# Patient Record
Sex: Female | Born: 1945
Health system: Southern US, Community
[De-identification: ages and names within clinical notes are randomized; demographics above are authoritative.]

## PROBLEM LIST (undated history)

## (undated) DIAGNOSIS — D649 Anemia, unspecified: Secondary | ICD-10-CM

## (undated) DIAGNOSIS — M810 Age-related osteoporosis without current pathological fracture: Secondary | ICD-10-CM

## (undated) DIAGNOSIS — E7800 Pure hypercholesterolemia, unspecified: Secondary | ICD-10-CM

## (undated) DIAGNOSIS — E559 Vitamin D deficiency, unspecified: Secondary | ICD-10-CM

## (undated) DIAGNOSIS — C801 Malignant (primary) neoplasm, unspecified: Secondary | ICD-10-CM

## (undated) DIAGNOSIS — E538 Deficiency of other specified B group vitamins: Secondary | ICD-10-CM

## (undated) DIAGNOSIS — K635 Polyp of colon: Secondary | ICD-10-CM

## (undated) HISTORY — DX: Age-related osteoporosis without current pathological fracture: M81.0

## (undated) HISTORY — PX: OTHER SURGICAL HISTORY: SHX169

## (undated) HISTORY — DX: Vitamin D deficiency, unspecified: E55.9

## (undated) HISTORY — DX: Polyp of colon: K63.5

## (undated) HISTORY — DX: Deficiency of other specified B group vitamins: E53.8

## (undated) HISTORY — DX: Anemia, unspecified: D64.9

## (undated) HISTORY — PX: COLONOSCOPY: SHX174

## (undated) HISTORY — DX: Pure hypercholesterolemia, unspecified: E78.00

---

## 1998-06-22 ENCOUNTER — Other Ambulatory Visit: Admission: RE | Admit: 1998-06-22 | Discharge: 1998-06-22 | Payer: Self-pay | Admitting: *Deleted

## 1999-09-27 ENCOUNTER — Encounter: Payer: Self-pay | Admitting: Internal Medicine

## 1999-09-27 ENCOUNTER — Encounter: Admission: RE | Admit: 1999-09-27 | Discharge: 1999-09-27 | Payer: Self-pay | Admitting: Internal Medicine

## 2000-06-05 ENCOUNTER — Ambulatory Visit (HOSPITAL_COMMUNITY): Admission: RE | Admit: 2000-06-05 | Discharge: 2000-06-05 | Payer: Self-pay | Admitting: Gastroenterology

## 2001-01-15 ENCOUNTER — Encounter: Admission: RE | Admit: 2001-01-15 | Discharge: 2001-01-15 | Payer: Self-pay | Admitting: Internal Medicine

## 2001-01-15 ENCOUNTER — Encounter: Payer: Self-pay | Admitting: Internal Medicine

## 2002-07-25 ENCOUNTER — Encounter: Payer: Self-pay | Admitting: Internal Medicine

## 2002-07-25 ENCOUNTER — Encounter: Admission: RE | Admit: 2002-07-25 | Discharge: 2002-07-25 | Payer: Self-pay | Admitting: Internal Medicine

## 2003-03-29 ENCOUNTER — Encounter: Payer: Self-pay | Admitting: *Deleted

## 2003-03-29 ENCOUNTER — Encounter: Admission: RE | Admit: 2003-03-29 | Discharge: 2003-03-29 | Payer: Self-pay | Admitting: *Deleted

## 2003-10-10 ENCOUNTER — Encounter: Admission: RE | Admit: 2003-10-10 | Discharge: 2003-10-10 | Payer: Self-pay | Admitting: Internal Medicine

## 2004-12-24 ENCOUNTER — Encounter: Admission: RE | Admit: 2004-12-24 | Discharge: 2004-12-24 | Payer: Self-pay | Admitting: *Deleted

## 2005-07-22 ENCOUNTER — Other Ambulatory Visit: Admission: RE | Admit: 2005-07-22 | Discharge: 2005-07-22 | Payer: Self-pay | Admitting: *Deleted

## 2006-04-22 ENCOUNTER — Encounter: Admission: RE | Admit: 2006-04-22 | Discharge: 2006-04-22 | Payer: Self-pay | Admitting: Internal Medicine

## 2007-07-20 ENCOUNTER — Encounter: Admission: RE | Admit: 2007-07-20 | Discharge: 2007-07-20 | Payer: Self-pay | Admitting: Family Medicine

## 2008-09-15 ENCOUNTER — Encounter: Admission: RE | Admit: 2008-09-15 | Discharge: 2008-09-15 | Payer: Self-pay | Admitting: Family Medicine

## 2009-09-21 ENCOUNTER — Encounter: Admission: RE | Admit: 2009-09-21 | Discharge: 2009-09-21 | Payer: Self-pay | Admitting: Family Medicine

## 2010-02-27 ENCOUNTER — Encounter: Admission: RE | Admit: 2010-02-27 | Discharge: 2010-02-27 | Payer: Self-pay | Admitting: Family Medicine

## 2010-08-24 ENCOUNTER — Encounter: Payer: Self-pay | Admitting: *Deleted

## 2010-09-03 ENCOUNTER — Other Ambulatory Visit: Payer: Self-pay | Admitting: Family Medicine

## 2010-09-03 DIAGNOSIS — Z1239 Encounter for other screening for malignant neoplasm of breast: Secondary | ICD-10-CM

## 2010-09-24 ENCOUNTER — Ambulatory Visit
Admission: RE | Admit: 2010-09-24 | Discharge: 2010-09-24 | Disposition: A | Discharge status: Home / Self Care | Payer: BC Managed Care – PPO | Source: Ambulatory Visit | Attending: Family Medicine | Admitting: Family Medicine

## 2010-09-24 DIAGNOSIS — Z1239 Encounter for other screening for malignant neoplasm of breast: Secondary | ICD-10-CM

## 2010-12-20 NOTE — Op Note (Signed)
[facility]  Patient:    Emily Bell, Emily Bell                      MRN: [id number] Adm. Date:  [id number] Attending:  Deneen Harts CC:         Gretta Cool, M.D.   Operative Report  PROCEDURE:  Colonoscopy.  INDICATIONS:  A 65 year old African-American female undergoing colonoscopy for neoplasia surveillance.  Last examination was four years ago, 1997, at which time cecal sessile adenomatous polyps were resected.  In addition, she underwent hemorrhoid injection sclerotherapy.  She remembers having quite a bit of pain in the rectum following injection therapy.  Standard scope was used without difficulties with intubation.  During the interim four years, she has done quite well.  She has postponed todays procedure for an additional year due to her recollections of discomfort at the time of colonoscopy, again due to injection sclerotherapy. Occasional bright red blood per rectum.  Denies pain.  No change in bowel habits.  DESCRIPTION OF PROCEDURE:  After reviewing the nature of the procedure with the patient including potential risks and complications including perforation and hemorrhage, informed consent was signed.  I agreed with the patient prior to starting the procedure that we would not employ injection sclerotherapy due to her pain symptoms when last applied.  The patient was premedicated receiving IV sedation administered in divided doses prior to and during the course of the procedure, totalling Versed 10 mg, Fentanyl 100 mcg IV.  Using an Olympus PCF-140L pediatric video colonoscope, the rectum was intubated after normal digital examination except for the finding of internal and external hemorrhoids.  The scope was inserted and advanced with some difficulty around a very tortuous, redundant hepatic flexure.  The intubation was much more difficult than was described with a standard scope, and I believe this was due to the lack of  stiffness of the pediatric model. Ultimately, the cecum was identified after rotating the patient into a right lateral decubitus position.  Preparation was good throughout.  Some retained Visicol residue in the right colon.  Cecum identified by the ileocecal valve.  Excellent inspection of the cecum did not reveal any residual neoplasia.  No new lesions identified.  The scope was slowly withdrawn with careful inspection of the entire colon in a retrograde manner including retroflex view in the rectal vault.  There was no evidence of colorectal neoplasia.  No diverticular disease, vascular lesion, or mucosal inflammation.  Retroflex view in the rectal vault did reveal large internal hemorrhoids with red spots and bright red blood mixed with mucous in the rectum, small volume. This could be irrigated clear.  There was no active oozing or bleeding.  The scope was straightened, and the colon was decompressed.  The scope was withdrawn.  The patient tolerated the procedure without difficulty being maintained on datascope monitor and low-flow oxygen throughout.  Returned to recovery in stable condition.  Time:  1 Technical:  2 Preparation:  2 Totals:  5  ASSESSMENT: 1. Internal hemorrhoids, bright red blood per rectum associated with    hemorrhoids.  Suspect hematochezia is also due to hemorrhoidal bleeding. 2. No evidence of colorectal neoplasia.  RECOMMENDATIONS: 1. Repeat colonoscopy in five years. 2. Annual hemoccult, though this may be compromised by patients    hemorrhoidal disease. 3. Anusol-HC suppository, one p.r. b.i.d. for one week.  Repeat p.r.n. 4. With follow-up endoscopy in five years, use standard scope for improved    intubation.  DD:  06/05/00 TD:  06/06/00 Job: [id number]

## 2011-04-19 ENCOUNTER — Inpatient Hospital Stay (INDEPENDENT_AMBULATORY_CARE_PROVIDER_SITE_OTHER)
Admission: RE | Admit: 2011-04-19 | Discharge: 2011-04-19 | Disposition: A | Discharge status: Home / Self Care | Payer: Medicare PPO | Source: Ambulatory Visit | Attending: Family Medicine | Admitting: Family Medicine

## 2011-04-19 DIAGNOSIS — B851 Pediculosis due to Pediculus humanus corporis: Secondary | ICD-10-CM

## 2011-04-19 DIAGNOSIS — L989 Disorder of the skin and subcutaneous tissue, unspecified: Secondary | ICD-10-CM

## 2011-11-25 ENCOUNTER — Other Ambulatory Visit: Payer: Self-pay | Admitting: Internal Medicine

## 2011-11-25 DIAGNOSIS — Z1231 Encounter for screening mammogram for malignant neoplasm of breast: Secondary | ICD-10-CM

## 2011-11-26 DIAGNOSIS — H6120 Impacted cerumen, unspecified ear: Secondary | ICD-10-CM | POA: Diagnosis not present

## 2011-12-08 ENCOUNTER — Ambulatory Visit: Payer: Medicare PPO

## 2012-01-13 ENCOUNTER — Ambulatory Visit
Admission: RE | Admit: 2012-01-13 | Discharge: 2012-01-13 | Disposition: A | Discharge status: Home / Self Care | Payer: Self-pay | Source: Ambulatory Visit | Attending: Internal Medicine | Admitting: Internal Medicine

## 2012-01-13 DIAGNOSIS — Z1231 Encounter for screening mammogram for malignant neoplasm of breast: Secondary | ICD-10-CM

## 2012-02-10 DIAGNOSIS — Z129 Encounter for screening for malignant neoplasm, site unspecified: Secondary | ICD-10-CM | POA: Diagnosis not present

## 2012-02-10 DIAGNOSIS — E7800 Pure hypercholesterolemia, unspecified: Secondary | ICD-10-CM | POA: Diagnosis not present

## 2012-02-10 DIAGNOSIS — Z79899 Other long term (current) drug therapy: Secondary | ICD-10-CM | POA: Diagnosis not present

## 2012-02-10 DIAGNOSIS — Z0000 Encounter for general adult medical examination without abnormal findings: Secondary | ICD-10-CM | POA: Diagnosis not present

## 2012-02-10 DIAGNOSIS — Z124 Encounter for screening for malignant neoplasm of cervix: Secondary | ICD-10-CM | POA: Diagnosis not present

## 2012-02-10 DIAGNOSIS — R5381 Other malaise: Secondary | ICD-10-CM | POA: Diagnosis not present

## 2012-02-10 DIAGNOSIS — R5383 Other fatigue: Secondary | ICD-10-CM | POA: Diagnosis not present

## 2012-02-16 DIAGNOSIS — R109 Unspecified abdominal pain: Secondary | ICD-10-CM | POA: Diagnosis not present

## 2012-02-16 DIAGNOSIS — K8020 Calculus of gallbladder without cholecystitis without obstruction: Secondary | ICD-10-CM | POA: Diagnosis not present

## 2012-06-02 DIAGNOSIS — Z23 Encounter for immunization: Secondary | ICD-10-CM | POA: Diagnosis not present

## 2012-06-14 DIAGNOSIS — R109 Unspecified abdominal pain: Secondary | ICD-10-CM | POA: Diagnosis not present

## 2012-06-16 DIAGNOSIS — R1011 Right upper quadrant pain: Secondary | ICD-10-CM | POA: Diagnosis not present

## 2012-06-16 DIAGNOSIS — R1012 Left upper quadrant pain: Secondary | ICD-10-CM | POA: Diagnosis not present

## 2012-08-24 DIAGNOSIS — N390 Urinary tract infection, site not specified: Secondary | ICD-10-CM | POA: Diagnosis not present

## 2012-09-28 DIAGNOSIS — K839 Disease of biliary tract, unspecified: Secondary | ICD-10-CM | POA: Diagnosis not present

## 2012-09-28 DIAGNOSIS — R109 Unspecified abdominal pain: Secondary | ICD-10-CM | POA: Diagnosis not present

## 2013-01-29 DIAGNOSIS — L2089 Other atopic dermatitis: Secondary | ICD-10-CM | POA: Diagnosis not present

## 2013-02-28 ENCOUNTER — Other Ambulatory Visit: Payer: Self-pay

## 2013-02-28 DIAGNOSIS — Z1231 Encounter for screening mammogram for malignant neoplasm of breast: Secondary | ICD-10-CM

## 2013-03-16 ENCOUNTER — Ambulatory Visit
Admission: RE | Admit: 2013-03-16 | Discharge: 2013-03-16 | Disposition: A | Discharge status: Home / Self Care | Payer: Self-pay | Source: Ambulatory Visit

## 2013-03-16 DIAGNOSIS — Z1231 Encounter for screening mammogram for malignant neoplasm of breast: Secondary | ICD-10-CM

## 2013-03-17 DIAGNOSIS — Z0000 Encounter for general adult medical examination without abnormal findings: Secondary | ICD-10-CM | POA: Diagnosis not present

## 2013-03-17 DIAGNOSIS — E7800 Pure hypercholesterolemia, unspecified: Secondary | ICD-10-CM | POA: Diagnosis not present

## 2013-03-17 DIAGNOSIS — R5381 Other malaise: Secondary | ICD-10-CM | POA: Diagnosis not present

## 2013-03-17 DIAGNOSIS — IMO0002 Reserved for concepts with insufficient information to code with codable children: Secondary | ICD-10-CM | POA: Diagnosis not present

## 2013-03-17 DIAGNOSIS — R5383 Other fatigue: Secondary | ICD-10-CM | POA: Diagnosis not present

## 2013-03-17 DIAGNOSIS — E559 Vitamin D deficiency, unspecified: Secondary | ICD-10-CM | POA: Diagnosis not present

## 2013-03-17 DIAGNOSIS — E538 Deficiency of other specified B group vitamins: Secondary | ICD-10-CM | POA: Diagnosis not present

## 2013-03-17 DIAGNOSIS — Z1331 Encounter for screening for depression: Secondary | ICD-10-CM | POA: Diagnosis not present

## 2013-03-17 DIAGNOSIS — Z9181 History of falling: Secondary | ICD-10-CM | POA: Diagnosis not present

## 2013-05-13 DIAGNOSIS — Z23 Encounter for immunization: Secondary | ICD-10-CM | POA: Diagnosis not present

## 2013-06-17 DIAGNOSIS — E559 Vitamin D deficiency, unspecified: Secondary | ICD-10-CM | POA: Diagnosis not present

## 2013-06-29 DIAGNOSIS — H40009 Preglaucoma, unspecified, unspecified eye: Secondary | ICD-10-CM | POA: Diagnosis not present

## 2013-06-29 DIAGNOSIS — H2510 Age-related nuclear cataract, unspecified eye: Secondary | ICD-10-CM | POA: Diagnosis not present

## 2013-07-13 ENCOUNTER — Encounter: Payer: Self-pay | Admitting: Internal Medicine

## 2013-07-13 DIAGNOSIS — K3189 Other diseases of stomach and duodenum: Secondary | ICD-10-CM | POA: Diagnosis not present

## 2013-07-13 DIAGNOSIS — R195 Other fecal abnormalities: Secondary | ICD-10-CM | POA: Diagnosis not present

## 2013-07-13 DIAGNOSIS — R300 Dysuria: Secondary | ICD-10-CM | POA: Diagnosis not present

## 2013-08-17 ENCOUNTER — Ambulatory Visit: Payer: Self-pay | Admitting: Internal Medicine

## 2013-09-12 ENCOUNTER — Encounter: Payer: Self-pay | Admitting: Internal Medicine

## 2013-09-12 ENCOUNTER — Ambulatory Visit (INDEPENDENT_AMBULATORY_CARE_PROVIDER_SITE_OTHER): Payer: Medicare Other | Admitting: Internal Medicine

## 2013-09-12 VITALS — BP 124/70 | HR 72 | Ht 64.5 in | Wt 148.0 lb

## 2013-09-12 DIAGNOSIS — R198 Other specified symptoms and signs involving the digestive system and abdomen: Secondary | ICD-10-CM | POA: Diagnosis not present

## 2013-09-12 DIAGNOSIS — R194 Change in bowel habit: Secondary | ICD-10-CM

## 2013-09-12 NOTE — Patient Instructions (Addendum)
Please discontinue Fenugreek.  We have given you samples of Florastor. This puts good bacteria back into your colon. You should take 1 capsule by mouth twice daily. If this works well for you, it can be purchased over the counter.  Please follow up as needed.

## 2013-09-12 NOTE — Progress Notes (Addendum)
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 Patient reports lifelong constipation for which she uses laxatives improving juice until November 2014. At that time she noticed that her stools were softer and more watery. Generally one movement each morning. No nocturnal bowel movements. No abdominal pain, weight loss, or bleeding. Finally, had a colonoscopy by Dr. Earlean Shawl of in the past few years. Those records have been requested. Now here due to insurance coverage. She takes multiple supplements including something called and fenugreek which is purported to help with digestion bowel movements. She also takes magnesium oxide. She takes iron. She tells me that she had problems with left-sided abdominal pain for which she went to [facility] a few months ago and had x-rays which are that she had "hiatal hernia and gallstones".  REVIEW OF SYSTEMS:  All non-GI ROS negative upon review  Past Medical History  Diagnosis Date  . Vitamin D deficiency   . Anemia   . Vitamin B 12 deficiency   . Osteoporosis   . Hypercholesteremia   . Colon polyps     Past Surgical History  Procedure Laterality Date  . Neg hx      Social History Emily Bell  reports that she has never smoked. She has never used smokeless tobacco. She reports that she does not drink alcohol or use illicit drugs.  family history includes Hypertension in her mother; Stomach cancer in her mother. There is no history of Colon cancer.  No Known Allergies     PHYSICAL EXAMINATION: Vital signs: BP 124/70  Pulse 72  Ht 5' 4.5" (1.638 m)  Wt 148 lb (67.132 kg)  BMI 25.02 kg/m2 General: Well-developed, well-nourished, no acute distress HEENT: Sclerae are anicteric, conjunctiva pink. Oral mucosa intact. Normal thyroid Lungs: Clear Heart: Regular Abdomen: soft, nontender,  nondistended, no obvious ascites, no peritoneal signs, normal bowel sounds. No organomegaly. Extremities: No edema  Neuro: Normal reflexes. No asterixis Psychiatric: alert and oriented x3. Cooperative.   ASSESSMENT:  #1. Nonspecific mild change in bowel habits as described. Possibly due to introduction of digestive supplements #2. Prior colonoscopy. Results requested #3. Incidental gallstones by patient report  PLAN:  #1. Supplements to see if bowel habit changes improve #2. This does not help then probiotic one daily for 3 weeks. Samples given #3. Review of outside colonoscopy report when available #4. GI followup as needed for persistent or worsening problems. Otherwise, return to the care of your PCP  ADDENDUM: Outside records reviewed 10/17/2013 1. Colonoscopy September 1997 (Dr. Earlean Shawl). Tubular adenoma and hyperplastic polyp 2. Colonoscopy December 2006 (Dr. Earlean Shawl). No polyps. Internal hemorrhoids 3. Colonoscopy March 2012 (Dr. Earlean Shawl) 1 cm descending tubular adenoma. Large hemorrhoids. Normal ileum. Followup in 5 years recommended.   We will make recall for colonoscopy around March 2017.

## 2013-10-17 ENCOUNTER — Telehealth: Payer: Self-pay

## 2013-10-17 NOTE — Telephone Encounter (Signed)
Message copied by Algernon Huxley on Mon Oct 17, 2013  1:51 PM ------      Message from: Irene Shipper      Created: Mon Oct 17, 2013  1:06 PM      Regarding: Recall colonoscopy       Vaughan Basta, outside records reviewed. Please make a recall colonoscopy for March 2017. Thank you ------

## 2013-10-17 NOTE — Telephone Encounter (Signed)
Recall entered in epic.

## 2013-11-01 DIAGNOSIS — R42 Dizziness and giddiness: Secondary | ICD-10-CM | POA: Diagnosis not present

## 2014-03-24 ENCOUNTER — Other Ambulatory Visit: Payer: Self-pay | Admitting: Family Medicine

## 2014-03-24 DIAGNOSIS — E559 Vitamin D deficiency, unspecified: Secondary | ICD-10-CM | POA: Diagnosis not present

## 2014-03-24 DIAGNOSIS — Z1231 Encounter for screening mammogram for malignant neoplasm of breast: Secondary | ICD-10-CM

## 2014-03-24 DIAGNOSIS — Z0000 Encounter for general adult medical examination without abnormal findings: Secondary | ICD-10-CM | POA: Diagnosis not present

## 2014-03-24 DIAGNOSIS — E7800 Pure hypercholesterolemia, unspecified: Secondary | ICD-10-CM | POA: Diagnosis not present

## 2014-03-24 DIAGNOSIS — D649 Anemia, unspecified: Secondary | ICD-10-CM | POA: Diagnosis not present

## 2014-03-24 DIAGNOSIS — Z79899 Other long term (current) drug therapy: Secondary | ICD-10-CM | POA: Diagnosis not present

## 2014-03-24 DIAGNOSIS — M810 Age-related osteoporosis without current pathological fracture: Secondary | ICD-10-CM | POA: Diagnosis not present

## 2014-03-24 DIAGNOSIS — Z124 Encounter for screening for malignant neoplasm of cervix: Secondary | ICD-10-CM | POA: Diagnosis not present

## 2014-03-29 ENCOUNTER — Ambulatory Visit: Payer: Medicare Other

## 2014-07-03 DIAGNOSIS — Z23 Encounter for immunization: Secondary | ICD-10-CM | POA: Diagnosis not present

## 2014-07-03 DIAGNOSIS — N390 Urinary tract infection, site not specified: Secondary | ICD-10-CM | POA: Diagnosis not present

## 2014-07-04 DIAGNOSIS — N390 Urinary tract infection, site not specified: Secondary | ICD-10-CM | POA: Diagnosis not present

## 2014-08-27 DIAGNOSIS — R001 Bradycardia, unspecified: Secondary | ICD-10-CM | POA: Diagnosis not present

## 2014-08-27 DIAGNOSIS — D649 Anemia, unspecified: Secondary | ICD-10-CM | POA: Diagnosis not present

## 2014-08-27 DIAGNOSIS — M542 Cervicalgia: Secondary | ICD-10-CM | POA: Diagnosis not present

## 2014-08-27 DIAGNOSIS — M810 Age-related osteoporosis without current pathological fracture: Secondary | ICD-10-CM | POA: Diagnosis not present

## 2014-08-27 DIAGNOSIS — R079 Chest pain, unspecified: Secondary | ICD-10-CM | POA: Diagnosis not present

## 2014-08-27 DIAGNOSIS — B9789 Other viral agents as the cause of diseases classified elsewhere: Secondary | ICD-10-CM | POA: Diagnosis not present

## 2014-08-27 DIAGNOSIS — J069 Acute upper respiratory infection, unspecified: Secondary | ICD-10-CM | POA: Diagnosis not present

## 2014-08-27 DIAGNOSIS — R200 Anesthesia of skin: Secondary | ICD-10-CM | POA: Diagnosis not present

## 2014-09-15 DIAGNOSIS — J069 Acute upper respiratory infection, unspecified: Secondary | ICD-10-CM | POA: Diagnosis not present

## 2014-09-15 DIAGNOSIS — Z6824 Body mass index (BMI) 24.0-24.9, adult: Secondary | ICD-10-CM | POA: Diagnosis not present

## 2014-10-05 DIAGNOSIS — H2513 Age-related nuclear cataract, bilateral: Secondary | ICD-10-CM | POA: Diagnosis not present

## 2014-10-05 DIAGNOSIS — H40003 Preglaucoma, unspecified, bilateral: Secondary | ICD-10-CM | POA: Diagnosis not present

## 2014-10-14 DIAGNOSIS — S43402A Unspecified sprain of left shoulder joint, initial encounter: Secondary | ICD-10-CM | POA: Diagnosis not present

## 2014-10-14 DIAGNOSIS — D649 Anemia, unspecified: Secondary | ICD-10-CM | POA: Diagnosis not present

## 2014-10-14 DIAGNOSIS — S4992XA Unspecified injury of left shoulder and upper arm, initial encounter: Secondary | ICD-10-CM | POA: Diagnosis not present

## 2014-10-14 DIAGNOSIS — M25512 Pain in left shoulder: Secondary | ICD-10-CM | POA: Diagnosis not present

## 2014-10-14 DIAGNOSIS — M810 Age-related osteoporosis without current pathological fracture: Secondary | ICD-10-CM | POA: Diagnosis not present

## 2015-03-27 DIAGNOSIS — Z0000 Encounter for general adult medical examination without abnormal findings: Secondary | ICD-10-CM | POA: Diagnosis not present

## 2015-03-27 DIAGNOSIS — Z1231 Encounter for screening mammogram for malignant neoplasm of breast: Secondary | ICD-10-CM | POA: Diagnosis not present

## 2015-03-27 DIAGNOSIS — E559 Vitamin D deficiency, unspecified: Secondary | ICD-10-CM | POA: Diagnosis not present

## 2015-03-27 DIAGNOSIS — D649 Anemia, unspecified: Secondary | ICD-10-CM | POA: Diagnosis not present

## 2015-03-27 DIAGNOSIS — Z79899 Other long term (current) drug therapy: Secondary | ICD-10-CM | POA: Diagnosis not present

## 2015-03-27 DIAGNOSIS — E780 Pure hypercholesterolemia: Secondary | ICD-10-CM | POA: Diagnosis not present

## 2015-03-27 DIAGNOSIS — Z9181 History of falling: Secondary | ICD-10-CM | POA: Diagnosis not present

## 2015-03-27 DIAGNOSIS — Z6825 Body mass index (BMI) 25.0-25.9, adult: Secondary | ICD-10-CM | POA: Diagnosis not present

## 2015-03-27 DIAGNOSIS — M810 Age-related osteoporosis without current pathological fracture: Secondary | ICD-10-CM | POA: Diagnosis not present

## 2015-03-28 ENCOUNTER — Other Ambulatory Visit: Payer: Self-pay | Admitting: Family Medicine

## 2015-03-28 DIAGNOSIS — M810 Age-related osteoporosis without current pathological fracture: Secondary | ICD-10-CM

## 2015-05-23 ENCOUNTER — Other Ambulatory Visit: Payer: Self-pay

## 2015-05-23 DIAGNOSIS — Z1231 Encounter for screening mammogram for malignant neoplasm of breast: Secondary | ICD-10-CM

## 2015-06-07 ENCOUNTER — Ambulatory Visit: Payer: Medicare Other

## 2015-06-19 ENCOUNTER — Ambulatory Visit
Admission: RE | Admit: 2015-06-19 | Discharge: 2015-06-19 | Disposition: A | Discharge status: Home / Self Care | Payer: Medicare Other | Source: Ambulatory Visit

## 2015-06-19 DIAGNOSIS — Z1231 Encounter for screening mammogram for malignant neoplasm of breast: Secondary | ICD-10-CM | POA: Diagnosis not present

## 2015-07-09 DIAGNOSIS — Z6825 Body mass index (BMI) 25.0-25.9, adult: Secondary | ICD-10-CM | POA: Diagnosis not present

## 2015-07-09 DIAGNOSIS — J069 Acute upper respiratory infection, unspecified: Secondary | ICD-10-CM | POA: Diagnosis not present

## 2015-10-08 DIAGNOSIS — H2513 Age-related nuclear cataract, bilateral: Secondary | ICD-10-CM | POA: Diagnosis not present

## 2015-10-08 DIAGNOSIS — H40003 Preglaucoma, unspecified, bilateral: Secondary | ICD-10-CM | POA: Diagnosis not present

## 2015-10-19 ENCOUNTER — Encounter: Payer: Self-pay | Admitting: Internal Medicine

## 2016-03-18 DIAGNOSIS — Z86010 Personal history of colonic polyps: Secondary | ICD-10-CM | POA: Diagnosis not present

## 2016-03-18 DIAGNOSIS — Z1211 Encounter for screening for malignant neoplasm of colon: Secondary | ICD-10-CM | POA: Diagnosis not present

## 2016-03-18 DIAGNOSIS — K591 Functional diarrhea: Secondary | ICD-10-CM | POA: Diagnosis not present

## 2016-03-27 DIAGNOSIS — R194 Change in bowel habit: Secondary | ICD-10-CM | POA: Diagnosis not present

## 2016-03-27 DIAGNOSIS — K635 Polyp of colon: Secondary | ICD-10-CM | POA: Diagnosis not present

## 2016-03-27 DIAGNOSIS — D649 Anemia, unspecified: Secondary | ICD-10-CM | POA: Diagnosis not present

## 2016-03-27 DIAGNOSIS — Z6824 Body mass index (BMI) 24.0-24.9, adult: Secondary | ICD-10-CM | POA: Diagnosis not present

## 2016-03-27 DIAGNOSIS — E559 Vitamin D deficiency, unspecified: Secondary | ICD-10-CM | POA: Diagnosis not present

## 2016-03-27 DIAGNOSIS — M810 Age-related osteoporosis without current pathological fracture: Secondary | ICD-10-CM | POA: Diagnosis not present

## 2016-03-27 DIAGNOSIS — Z1389 Encounter for screening for other disorder: Secondary | ICD-10-CM | POA: Diagnosis not present

## 2016-03-27 DIAGNOSIS — E7800 Pure hypercholesterolemia, unspecified: Secondary | ICD-10-CM | POA: Diagnosis not present

## 2016-03-27 DIAGNOSIS — Z0000 Encounter for general adult medical examination without abnormal findings: Secondary | ICD-10-CM | POA: Diagnosis not present

## 2016-04-04 ENCOUNTER — Other Ambulatory Visit: Payer: Self-pay | Admitting: Physician Assistant

## 2016-04-04 DIAGNOSIS — Z1231 Encounter for screening mammogram for malignant neoplasm of breast: Secondary | ICD-10-CM

## 2016-04-30 DIAGNOSIS — Z86010 Personal history of colonic polyps: Secondary | ICD-10-CM | POA: Diagnosis not present

## 2016-04-30 DIAGNOSIS — K591 Functional diarrhea: Secondary | ICD-10-CM | POA: Diagnosis not present

## 2016-04-30 DIAGNOSIS — Z79899 Other long term (current) drug therapy: Secondary | ICD-10-CM | POA: Diagnosis not present

## 2016-04-30 DIAGNOSIS — E559 Vitamin D deficiency, unspecified: Secondary | ICD-10-CM | POA: Diagnosis not present

## 2016-04-30 DIAGNOSIS — M810 Age-related osteoporosis without current pathological fracture: Secondary | ICD-10-CM | POA: Diagnosis not present

## 2016-04-30 DIAGNOSIS — K5730 Diverticulosis of large intestine without perforation or abscess without bleeding: Secondary | ICD-10-CM | POA: Diagnosis not present

## 2016-04-30 DIAGNOSIS — E785 Hyperlipidemia, unspecified: Secondary | ICD-10-CM | POA: Diagnosis not present

## 2016-04-30 DIAGNOSIS — Z1211 Encounter for screening for malignant neoplasm of colon: Secondary | ICD-10-CM | POA: Diagnosis not present

## 2016-04-30 DIAGNOSIS — K648 Other hemorrhoids: Secondary | ICD-10-CM | POA: Diagnosis not present

## 2016-04-30 DIAGNOSIS — K649 Unspecified hemorrhoids: Secondary | ICD-10-CM | POA: Diagnosis not present

## 2016-06-20 ENCOUNTER — Ambulatory Visit
Admission: RE | Admit: 2016-06-20 | Discharge: 2016-06-20 | Disposition: A | Discharge status: Home / Self Care | Payer: Medicare Other | Source: Ambulatory Visit | Attending: Physician Assistant | Admitting: Physician Assistant

## 2016-06-20 DIAGNOSIS — Z1231 Encounter for screening mammogram for malignant neoplasm of breast: Secondary | ICD-10-CM

## 2016-07-23 ENCOUNTER — Other Ambulatory Visit: Payer: Self-pay | Admitting: Nurse Practitioner

## 2016-07-23 DIAGNOSIS — M810 Age-related osteoporosis without current pathological fracture: Secondary | ICD-10-CM

## 2016-07-31 ENCOUNTER — Ambulatory Visit
Admission: RE | Admit: 2016-07-31 | Discharge: 2016-07-31 | Disposition: A | Discharge status: Home / Self Care | Payer: Medicare Other | Source: Ambulatory Visit | Attending: Nurse Practitioner | Admitting: Nurse Practitioner

## 2016-07-31 DIAGNOSIS — M810 Age-related osteoporosis without current pathological fracture: Secondary | ICD-10-CM

## 2016-07-31 DIAGNOSIS — Z780 Asymptomatic menopausal state: Secondary | ICD-10-CM | POA: Diagnosis not present

## 2016-07-31 DIAGNOSIS — M8588 Other specified disorders of bone density and structure, other site: Secondary | ICD-10-CM | POA: Diagnosis not present

## 2016-08-12 DIAGNOSIS — Z23 Encounter for immunization: Secondary | ICD-10-CM | POA: Diagnosis not present

## 2016-08-29 DIAGNOSIS — D649 Anemia, unspecified: Secondary | ICD-10-CM | POA: Diagnosis not present

## 2016-08-29 DIAGNOSIS — E559 Vitamin D deficiency, unspecified: Secondary | ICD-10-CM | POA: Diagnosis not present

## 2016-08-29 DIAGNOSIS — E7800 Pure hypercholesterolemia, unspecified: Secondary | ICD-10-CM | POA: Diagnosis not present

## 2016-12-30 DIAGNOSIS — L300 Nummular dermatitis: Secondary | ICD-10-CM | POA: Diagnosis not present

## 2016-12-30 DIAGNOSIS — L958 Other vasculitis limited to the skin: Secondary | ICD-10-CM | POA: Diagnosis not present

## 2017-04-01 DIAGNOSIS — M810 Age-related osteoporosis without current pathological fracture: Secondary | ICD-10-CM | POA: Diagnosis not present

## 2017-04-01 DIAGNOSIS — Z0000 Encounter for general adult medical examination without abnormal findings: Secondary | ICD-10-CM | POA: Diagnosis not present

## 2017-04-01 DIAGNOSIS — D649 Anemia, unspecified: Secondary | ICD-10-CM | POA: Diagnosis not present

## 2017-04-01 DIAGNOSIS — Z6824 Body mass index (BMI) 24.0-24.9, adult: Secondary | ICD-10-CM | POA: Diagnosis not present

## 2017-04-01 DIAGNOSIS — Z23 Encounter for immunization: Secondary | ICD-10-CM | POA: Diagnosis not present

## 2017-04-01 DIAGNOSIS — E559 Vitamin D deficiency, unspecified: Secondary | ICD-10-CM | POA: Diagnosis not present

## 2017-04-01 DIAGNOSIS — E7800 Pure hypercholesterolemia, unspecified: Secondary | ICD-10-CM | POA: Diagnosis not present

## 2017-04-01 DIAGNOSIS — Z9181 History of falling: Secondary | ICD-10-CM | POA: Diagnosis not present

## 2017-06-16 DIAGNOSIS — Z23 Encounter for immunization: Secondary | ICD-10-CM | POA: Diagnosis not present

## 2017-07-23 DIAGNOSIS — H2513 Age-related nuclear cataract, bilateral: Secondary | ICD-10-CM | POA: Diagnosis not present

## 2017-07-23 DIAGNOSIS — H40003 Preglaucoma, unspecified, bilateral: Secondary | ICD-10-CM | POA: Diagnosis not present

## 2018-04-02 ENCOUNTER — Other Ambulatory Visit: Payer: Self-pay | Admitting: Physician Assistant

## 2018-04-02 DIAGNOSIS — Z139 Encounter for screening, unspecified: Secondary | ICD-10-CM | POA: Diagnosis not present

## 2018-04-02 DIAGNOSIS — Z0000 Encounter for general adult medical examination without abnormal findings: Secondary | ICD-10-CM | POA: Diagnosis not present

## 2018-04-02 DIAGNOSIS — E2839 Other primary ovarian failure: Secondary | ICD-10-CM

## 2018-04-02 DIAGNOSIS — Z1339 Encounter for screening examination for other mental health and behavioral disorders: Secondary | ICD-10-CM | POA: Diagnosis not present

## 2018-04-02 DIAGNOSIS — R5383 Other fatigue: Secondary | ICD-10-CM | POA: Diagnosis not present

## 2018-04-02 DIAGNOSIS — Z136 Encounter for screening for cardiovascular disorders: Secondary | ICD-10-CM | POA: Diagnosis not present

## 2018-04-02 DIAGNOSIS — Z9181 History of falling: Secondary | ICD-10-CM | POA: Diagnosis not present

## 2018-04-02 DIAGNOSIS — Z1231 Encounter for screening mammogram for malignant neoplasm of breast: Secondary | ICD-10-CM

## 2018-04-02 DIAGNOSIS — E785 Hyperlipidemia, unspecified: Secondary | ICD-10-CM | POA: Diagnosis not present

## 2018-04-02 DIAGNOSIS — Z1331 Encounter for screening for depression: Secondary | ICD-10-CM | POA: Diagnosis not present

## 2018-04-02 DIAGNOSIS — D649 Anemia, unspecified: Secondary | ICD-10-CM | POA: Diagnosis not present

## 2018-04-02 DIAGNOSIS — E559 Vitamin D deficiency, unspecified: Secondary | ICD-10-CM | POA: Diagnosis not present

## 2018-04-02 DIAGNOSIS — M810 Age-related osteoporosis without current pathological fracture: Secondary | ICD-10-CM | POA: Diagnosis not present

## 2018-04-02 DIAGNOSIS — E7800 Pure hypercholesterolemia, unspecified: Secondary | ICD-10-CM | POA: Diagnosis not present

## 2018-06-01 DIAGNOSIS — Z23 Encounter for immunization: Secondary | ICD-10-CM | POA: Diagnosis not present

## 2018-06-09 ENCOUNTER — Ambulatory Visit
Admission: RE | Admit: 2018-06-09 | Discharge: 2018-06-09 | Disposition: A | Discharge status: Home / Self Care | Payer: Medicare Other | Source: Ambulatory Visit | Attending: Physician Assistant | Admitting: Physician Assistant

## 2018-06-09 DIAGNOSIS — Z1231 Encounter for screening mammogram for malignant neoplasm of breast: Secondary | ICD-10-CM

## 2018-06-10 ENCOUNTER — Other Ambulatory Visit: Payer: Self-pay | Admitting: Physician Assistant

## 2018-06-10 DIAGNOSIS — N644 Mastodynia: Secondary | ICD-10-CM

## 2018-06-11 ENCOUNTER — Other Ambulatory Visit: Payer: Self-pay | Admitting: Physician Assistant

## 2018-06-11 DIAGNOSIS — N644 Mastodynia: Secondary | ICD-10-CM

## 2018-06-17 ENCOUNTER — Other Ambulatory Visit: Payer: Self-pay | Admitting: Physician Assistant

## 2018-06-17 ENCOUNTER — Ambulatory Visit
Admission: RE | Admit: 2018-06-17 | Discharge: 2018-06-17 | Disposition: A | Discharge status: Home / Self Care | Payer: Medicare Other | Source: Ambulatory Visit | Attending: Physician Assistant | Admitting: Physician Assistant

## 2018-06-17 DIAGNOSIS — N6314 Unspecified lump in the right breast, lower inner quadrant: Secondary | ICD-10-CM | POA: Diagnosis not present

## 2018-06-17 DIAGNOSIS — N6310 Unspecified lump in the right breast, unspecified quadrant: Secondary | ICD-10-CM

## 2018-06-17 DIAGNOSIS — N644 Mastodynia: Secondary | ICD-10-CM

## 2018-06-17 DIAGNOSIS — N6312 Unspecified lump in the right breast, upper inner quadrant: Secondary | ICD-10-CM | POA: Diagnosis not present

## 2018-06-17 DIAGNOSIS — N6489 Other specified disorders of breast: Secondary | ICD-10-CM | POA: Diagnosis not present

## 2018-06-17 DIAGNOSIS — R928 Other abnormal and inconclusive findings on diagnostic imaging of breast: Secondary | ICD-10-CM | POA: Diagnosis not present

## 2018-06-21 ENCOUNTER — Ambulatory Visit
Admission: RE | Admit: 2018-06-21 | Discharge: 2018-06-21 | Disposition: A | Discharge status: Home / Self Care | Payer: Medicare Other | Source: Ambulatory Visit | Attending: Physician Assistant | Admitting: Physician Assistant

## 2018-06-21 DIAGNOSIS — C50811 Malignant neoplasm of overlapping sites of right female breast: Secondary | ICD-10-CM | POA: Diagnosis not present

## 2018-06-21 DIAGNOSIS — N6310 Unspecified lump in the right breast, unspecified quadrant: Secondary | ICD-10-CM

## 2018-06-21 DIAGNOSIS — N6315 Unspecified lump in the right breast, overlapping quadrants: Secondary | ICD-10-CM | POA: Diagnosis not present

## 2018-06-21 DIAGNOSIS — N6312 Unspecified lump in the right breast, upper inner quadrant: Secondary | ICD-10-CM | POA: Diagnosis not present

## 2018-06-25 ENCOUNTER — Other Ambulatory Visit: Payer: Self-pay | Admitting: Surgery

## 2018-06-25 DIAGNOSIS — C50911 Malignant neoplasm of unspecified site of right female breast: Secondary | ICD-10-CM | POA: Diagnosis not present

## 2018-06-25 DIAGNOSIS — Z853 Personal history of malignant neoplasm of breast: Secondary | ICD-10-CM

## 2018-06-30 NOTE — Progress Notes (Signed)
error 

## 2018-06-30 NOTE — Progress Notes (Signed)
Location of Breast Cancer: Right Breast  Histology per Pathology Report:  Diagnosis 06/21/18 Breast, right, needle core biopsy, 3:00 o'clock - INVASIVE MAMMARY CARCINOMA, GRADE II - III. SEE NOTE. - MAMMARY CARCINOMA IN SITU, INTERMEDIATE NUCLEAR GRADE.  Receptor Status: ER(90%), PR (90%), Her2-neu (NEG), Ki-(5%)  Did patient present with symptoms or was this found on screening mammography?: She felt soreness to her right breast, and scheduled a mammogram earlier than one year.    Past/Anticipated interventions by surgeon, if any: Dr. Ninfa Linden- surgery scheduled for 07/12/18  Past/Anticipated interventions by medical oncology, if any:  Dr. Jana Hakim 07/26/18  Lymphedema issues, if any:  N/A  Pain issues, if any:  She denies.   SAFETY ISSUES:  Prior radiation? No  Pacemaker/ICD? No  Possible current pregnancy? No  Is the patient on methotrexate? No  Current Complaints / other details:    BP 117/64 (BP Location: Left Arm, Patient Position: Sitting)   Pulse (!) 59   Temp 98.2 F (36.8 C) (Oral)   Resp 18   Ht '5\' 5"'  (1.651 m)   Wt 148 lb 6 oz (67.3 kg)   SpO2 99%   BMI 24.69 kg/m    Wt Readings from Last 3 Encounters:  07/07/18 148 lb 6 oz (67.3 kg)  09/12/13 148 lb (67.1 kg)      Pascuala Klutts, Stephani Police, RN 06/30/2018,10:08 AM

## 2018-07-05 ENCOUNTER — Encounter: Payer: Self-pay | Admitting: Oncology

## 2018-07-06 ENCOUNTER — Inpatient Hospital Stay (HOSPITAL_COMMUNITY): Admission: RE | Admit: 2018-07-06 | Payer: Medicare Other | Source: Ambulatory Visit

## 2018-07-06 ENCOUNTER — Ambulatory Visit
Admission: RE | Admit: 2018-07-06 | Discharge: 2018-07-06 | Disposition: A | Discharge status: Home / Self Care | Payer: Medicare Other | Source: Ambulatory Visit | Attending: Radiation Oncology | Admitting: Radiation Oncology

## 2018-07-06 ENCOUNTER — Ambulatory Visit: Payer: Medicare Other

## 2018-07-06 NOTE — Progress Notes (Signed)
Radiation Oncology         [phone] ________________________________  Initial Outpatient Consultation  Name: Emily Bell MRN: [id number]  Date: 07/07/2018  DOB: May 31, 1946  [id number], Lorin Mercy, MD  Coralie Keens, MD   REFERRING PHYSICIAN: Coralie Keens, MD  DIAGNOSIS:    ICD-10-CM   1. Carcinoma of central portion of right breast in female, estrogen receptor positive ([place]) C50.111    Z17.0    Stage IA Right Breast Invasive Mammary Carcinoma, ER(+) / PR(+) / Her2(-), Grade 2-3 Cancer Staging Carcinoma of central portion of right breast in female, estrogen receptor positive ([place]) Staging form: Breast, AJCC 8th Edition - Clinical: Stage IA (cT1c, cN0, cM0, G3, ER+, PR+, HER2-) - Signed by Eppie Gibson, MD on 07/10/2018   CHIEF COMPLAINT: Here to discuss management of right breast cancer  HISTORY OF PRESENT ILLNESS::Emily Bell is a 72 y.o. female who presented for evaluation of focal pain in the retroareolar right breast as well as in the inferior left breast near the inframammary fold. Diagnostic mammogram and ultrasound on 06/17/18 showed a suspicious 1.1 cm mass in the right breast at 3:00. No evidence of right axillary lymphadenopathy. No mammographic or targeted sonographic abnormalities were identified in the inferior left breast to account for the patient's breast pain. No mammographic evidence of left breast malignancy.  Biopsy on date of 06/21/18 showed invasive mammary carcinoma and intermediate grade mammary carcinoma in situ; ER positive, PR positive, Her2 negative, Grade 2-3.  She is scheduled for right breast partial mastectomy with sentinel lymph node biopsy on 07/12/18 with Dr. Ninfa Linden.  She is with her husband today. She feels well otherwise.  PREVIOUS RADIATION THERAPY: No  PAST MEDICAL HISTORY:  has a past medical history of Anemia, Cancer ([place]), Colon polyps, Hypercholesteremia, Osteoporosis, Vitamin B 12 deficiency, and Vitamin D deficiency.     PAST SURGICAL HISTORY: Past Surgical History:  Procedure Laterality Date  . COLONOSCOPY    . neg hx      FAMILY HISTORY: family history includes Hypertension in her mother; Stomach cancer in her mother.  SOCIAL HISTORY:  reports that she has never smoked. She has never used smokeless tobacco. She reports that she does not drink alcohol or use drugs.  ALLERGIES: Patient has no known allergies.  MEDICATIONS:  Current Outpatient Medications  Medication Sig Dispense Refill  . alendronate (FOSAMAX) 70 MG tablet Take 70 mg by mouth every Monday. Take with a full glass of water on an empty stomach.     Marland Kitchen aspirin 81 MG tablet Take 81 mg by mouth daily.    . Calcium Carb-Cholecalciferol (CALCIUM 600 + D PO) Take 1 tablet by mouth daily.    . Cholecalciferol (VITAMIN D3) 2000 UNITS TABS Take 2,000 Units by mouth daily.     . Coenzyme Q10 (CO Q 10 PO) Take 200 mg by mouth daily.     . magnesium oxide (MAG-OX) 400 MG tablet Take 400 mg by mouth daily.    . vitamin B-12 (CYANOCOBALAMIN) 1000 MCG tablet Take 1,000 mcg by mouth daily.     No current facility-administered medications for this encounter.     REVIEW OF SYSTEMS: A 10+ POINT REVIEW OF SYSTEMS WAS OBTAINED including neurology, dermatology, psychiatry, cardiac, respiratory, lymph, extremities, GI, GU, Musculoskeletal, constitutional, breasts, reproductive, HEENT.  All pertinent positives are noted in the HPI.  All others are negative.   PHYSICAL EXAM:  height is '5\' 5"'  (1.651 m) and weight is 148 lb 6 oz (67.3 kg).  Her oral temperature is 98.2 F (36.8 C). Her blood pressure is 117/64 and her pulse is 59 (abnormal). Her respiration is 18 and oxygen saturation is 99%.   General: Alert and oriented, in no acute distress. HEENT: Head is normocephalic. Extraocular movements are intact. Oropharynx is clear. Neck: Neck is supple, no palpable cervical or supraclavicular lymphadenopathy. Heart: Regular in rate and rhythm with no murmurs,  rubs, or gallops. Chest: Clear to auscultation bilaterally, with no rhonchi, wheezes, or rales. Abdomen: Soft, nontender, nondistended, with no rigidity or guarding. Extremities: No cyanosis or edema. Lymphatics: see Neck Exam Skin: No concerning lesions. Musculoskeletal: Symmetric strength and muscle tone throughout. Neurologic: Cranial nerves II through XII are grossly intact. No obvious focalities. Speech is fluent. Coordination is intact. Psychiatric: Judgment and insight are intact. Affect is appropriate. Breasts: At the 3:00 position of the right breast there was a 1.5 cm mass just adjacent to the right nipple. No other palpable masses appreciated in the breasts or axillae bilaterally.    ECOG = 0  0 - Asymptomatic (Fully active, able to carry on all predisease activities without restriction)  1 - Symptomatic but completely ambulatory (Restricted in physically strenuous activity but ambulatory and able to carry out work of a light or sedentary nature. For example, light housework, office work)  2 - Symptomatic, <50% in bed during the day (Ambulatory and capable of all self care but unable to carry out any work activities. Up and about more than 50% of waking hours)  3 - Symptomatic, >50% in bed, but not bedbound (Capable of only limited self-care, confined to bed or chair 50% or more of waking hours)  4 - Bedbound (Completely disabled. Cannot carry on any self-care. Totally confined to bed or chair)  5 - Death   Eustace Pen MM, Creech RH, Tormey DC, et al. [phone]). "Toxicity and response criteria of the [facility]". [place] Oncol. 5 (6): 649-55   LABORATORY DATA:  Lab Results  Component Value Date   WBC 5.0 07/09/2018   HGB 12.3 07/09/2018   HCT 41.5 07/09/2018   MCV 87.6 07/09/2018   PLT 281 07/09/2018   CMP     Component Value Date/Time   NA 141 07/09/2018 1320   K 4.1 07/09/2018 1320   CL 109 07/09/2018 1320   CO2 25 07/09/2018 1320    GLUCOSE 101 (H) 07/09/2018 1320   BUN 9 07/09/2018 1320   CREATININE 1.01 (H) 07/09/2018 1320   CALCIUM 9.3 07/09/2018 1320   GFRNONAA 56 (L) 07/09/2018 1320   GFRAA >60 07/09/2018 1320         RADIOGRAPHY: US Breast Ltd Uni Left Inc Axilla  Result Date: 06/17/2018 CLINICAL DATA:  72 year old female presenting for evaluation of focal pain in the retroareolar right breast as well as in the inferior left breast near the inframammary fold. EXAM: DIGITAL DIAGNOSTIC BILATERAL MAMMOGRAM WITH CAD AND TOMO ULTRASOUND BILATERAL BREAST COMPARISON:  Previous exam(s). ACR Breast Density Category b: There are scattered areas of fibroglandular density. FINDINGS: In the lower slightly inner quadrant of the right breast there is an irregular mass which on the CC view appears to be associated with some distortion. The mass measures approximately 1 cm. No suspicious findings are seen at the site of focal pain in the inferior left breast. Mammographic images were processed with CAD. On physical exam, no discrete palpable masses are identified in the medial right breast. Ultrasound of the right breast at 3 o'clock, 1 cm from  the nipple demonstrates an irregular hypoechoic mass measuring 1.1 x 0.5 x 0.7 cm. Ultrasound of the right axilla demonstrates multiple normal-appearing lymph nodes. Ultrasound of the left breast at the site of pain demonstrates normal fibroglandular tissue. No masses or suspicious areas of shadowing are identified. IMPRESSION: 1.  There is a suspicious mass in the right breast at 3 o'clock. 2.  No evidence of right axillary lymphadenopathy. 3. No mammographic or targeted sonographic abnormalities are identified in the inferior left breast to account for the patient's breast pain. 4.  No mammographic evidence of left breast malignancy. RECOMMENDATION: Ultrasound guided biopsy is recommended for the right breast mass. This has been scheduled for 06/21/2018 at 8:30 a.m. I have discussed the findings and  recommendations with the patient. Results were also provided in writing at the conclusion of the visit. If applicable, a reminder letter will be sent to the patient regarding the next appointment. BI-RADS CATEGORY  4: Suspicious. Electronically Signed   By: Ammie Ferrier M.D.   On: 06/17/2018 14:24   US Breast Ltd Uni Right Inc Axilla  Result Date: 06/17/2018 CLINICAL DATA:  72 year old female presenting for evaluation of focal pain in the retroareolar right breast as well as in the inferior left breast near the inframammary fold. EXAM: DIGITAL DIAGNOSTIC BILATERAL MAMMOGRAM WITH CAD AND TOMO ULTRASOUND BILATERAL BREAST COMPARISON:  Previous exam(s). ACR Breast Density Category b: There are scattered areas of fibroglandular density. FINDINGS: In the lower slightly inner quadrant of the right breast there is an irregular mass which on the CC view appears to be associated with some distortion. The mass measures approximately 1 cm. No suspicious findings are seen at the site of focal pain in the inferior left breast. Mammographic images were processed with CAD. On physical exam, no discrete palpable masses are identified in the medial right breast. Ultrasound of the right breast at 3 o'clock, 1 cm from the nipple demonstrates an irregular hypoechoic mass measuring 1.1 x 0.5 x 0.7 cm. Ultrasound of the right axilla demonstrates multiple normal-appearing lymph nodes. Ultrasound of the left breast at the site of pain demonstrates normal fibroglandular tissue. No masses or suspicious areas of shadowing are identified. IMPRESSION: 1.  There is a suspicious mass in the right breast at 3 o'clock. 2.  No evidence of right axillary lymphadenopathy. 3. No mammographic or targeted sonographic abnormalities are identified in the inferior left breast to account for the patient's breast pain. 4.  No mammographic evidence of left breast malignancy. RECOMMENDATION: Ultrasound guided biopsy is recommended for the right breast  mass. This has been scheduled for 06/21/2018 at 8:30 a.m. I have discussed the findings and recommendations with the patient. Results were also provided in writing at the conclusion of the visit. If applicable, a reminder letter will be sent to the patient regarding the next appointment. BI-RADS CATEGORY  4: Suspicious. Electronically Signed   By: Ammie Ferrier M.D.   On: 06/17/2018 14:24   Mm Diag Breast Tomo Bilateral  Result Date: 06/17/2018 CLINICAL DATA:  72 year old female presenting for evaluation of focal pain in the retroareolar right breast as well as in the inferior left breast near the inframammary fold. EXAM: DIGITAL DIAGNOSTIC BILATERAL MAMMOGRAM WITH CAD AND TOMO ULTRASOUND BILATERAL BREAST COMPARISON:  Previous exam(s). ACR Breast Density Category b: There are scattered areas of fibroglandular density. FINDINGS: In the lower slightly inner quadrant of the right breast there is an irregular mass which on the CC view appears to be associated with some distortion. The  mass measures approximately 1 cm. No suspicious findings are seen at the site of focal pain in the inferior left breast. Mammographic images were processed with CAD. On physical exam, no discrete palpable masses are identified in the medial right breast. Ultrasound of the right breast at 3 o'clock, 1 cm from the nipple demonstrates an irregular hypoechoic mass measuring 1.1 x 0.5 x 0.7 cm. Ultrasound of the right axilla demonstrates multiple normal-appearing lymph nodes. Ultrasound of the left breast at the site of pain demonstrates normal fibroglandular tissue. No masses or suspicious areas of shadowing are identified. IMPRESSION: 1.  There is a suspicious mass in the right breast at 3 o'clock. 2.  No evidence of right axillary lymphadenopathy. 3. No mammographic or targeted sonographic abnormalities are identified in the inferior left breast to account for the patient's breast pain. 4.  No mammographic evidence of left breast  malignancy. RECOMMENDATION: Ultrasound guided biopsy is recommended for the right breast mass. This has been scheduled for 06/21/2018 at 8:30 a.m. I have discussed the findings and recommendations with the patient. Results were also provided in writing at the conclusion of the visit. If applicable, a reminder letter will be sent to the patient regarding the next appointment. BI-RADS CATEGORY  4: Suspicious. Electronically Signed   By: Ammie Ferrier M.D.   On: 06/17/2018 14:24   Mm Rt Radioactive Seed Loc Mammo Guide  Result Date: 07/09/2018 CLINICAL DATA:  The patient presents for seed localization prior to lumpectomy for recently diagnosed invasive mammary carcinoma in the RIGHT breast 3 o'clock location. EXAM: MAMMOGRAPHIC GUIDED RADIOACTIVE SEED LOCALIZATION OF THE RIGHT BREAST COMPARISON:  Previous exam(s). FINDINGS: Patient presents for radioactive seed localization prior to lumpectomy. I met with the patient and we discussed the procedure of seed localization including benefits and alternatives. We discussed the high likelihood of a successful procedure. We discussed the risks of the procedure including infection, bleeding, tissue injury and further surgery. We discussed the low dose of radioactivity involved in the procedure. Informed, written consent was given. The usual time-out protocol was performed immediately prior to the procedure. Using mammographic guidance, sterile technique, 1% lidocaine and an I-125 radioactive seed, the ribbon shaped clip in the MEDIAL aspect of the RIGHT breast was localized using a MEDIAL to LATERAL approach. The follow-up mammogram images confirm the seed in the expected location and were marked for Dr. Ninfa Linden. Follow-up survey of the patient confirms presence of the radioactive seed. Order number of I-125 seed:  [id number]. Total activity:  1.308 millicuries reference Date: 06/14/2017 The patient tolerated the procedure well and was released from the [place].  She was given instructions regarding seed removal. IMPRESSION: Radioactive seed localization RIGHT breast. No apparent complications. Electronically Signed   By: Nolon Nations M.D.   On: 07/09/2018 15:44   Mm Clip Placement Right  Result Date: 06/21/2018 CLINICAL DATA:  Status post ultrasound-guided core needle biopsy right breast mass 3 o'clock position EXAM: DIAGNOSTIC RIGHT MAMMOGRAM POST ULTRASOUND BIOPSY COMPARISON:  Previous exam(s). FINDINGS: Mammographic images were obtained following ultrasound guided biopsy of right breast mass 3 o'clock position. Ribbon shaped marking clip in appropriate position. IMPRESSION: Appropriate position ribbon shaped marking clip status post ultrasound-guided biopsy right breast mass. Final Assessment: Post Procedure Mammograms for Marker Placement Electronically Signed   By: Lovey Newcomer M.D.   On: 06/21/2018 09:37   [place] Rt Breast Bx W Loc Dev 1st Lesion Img Bx Spec US Guide  Addendum Date: 06/25/2018   ADDENDUM REPORT: 06/23/2018 11:05 ADDENDUM:  Pathology revealed GRADE II - III INVASIVE MAMMARY CARCINOMA, INTERMEDIATE NUCLEAR GRADE MAMMARY CARCINOMA IN SITU of the Right breast, 3:00 o'clock. This was found to be concordant by [name]. Pathology results were discussed with the patient by telephone. The patient reported doing well after the biopsy with tenderness at the site. Post biopsy instructions and care were reviewed and questions were answered. The patient was encouraged to call [facility] for any additional concerns. Surgical consultation has been arranged with Dr. Nedra Hai at [facility] on June 25, 2018. Pathology results reported by Terie Purser, RN on 06/23/2018. Electronically Signed   By: Lovey Newcomer M.D.   On: 06/23/2018 11:05   Result Date: 06/25/2018 CLINICAL DATA:  Patient with indeterminate right breast mass 3 o'clock position EXAM: ULTRASOUND GUIDED RIGHT BREAST CORE NEEDLE BIOPSY  COMPARISON:  Previous exam(s). FINDINGS: I met with the patient and we discussed the procedure of ultrasound-guided biopsy, including benefits and alternatives. We discussed the high likelihood of a successful procedure. We discussed the risks of the procedure, including infection, bleeding, tissue injury, clip migration, and inadequate sampling. Informed written consent was given. The usual time-out protocol was performed immediately prior to the procedure. Lesion quadrant: Upper inner quadrant Using sterile technique and 1% Lidocaine as local anesthetic, under direct ultrasound visualization, a 12 gauge spring-loaded device was used to perform biopsy of right breast mass 3 o'clock position using a medial approach. At the conclusion of the procedure a ribbon shaped tissue marker clip was deployed into the biopsy cavity. Follow up 2 view mammogram was performed and dictated separately. IMPRESSION: Ultrasound guided biopsy of right breast mass 3 o'clock position. No apparent complications. Electronically Signed: By: Lovey Newcomer M.D. On: 06/21/2018 09:35      IMPRESSION/PLAN: Right Breast Cancer, Stage I, ER positive I talked to her about the option of a mastectomy and informed her that her expected overall survival would be equivalent between mastectomy and breast conservation, based upon randomized controlled data. She is enthusiastic about breast conservation.  For the patient's early stage favorable risk breast cancer, we had a thorough discussion about her options for adjuvant therapy. One option would be antiestrogen therapy as discussed with medical oncology. She would take a pill for approximately 5 years. The alternative option (but less standard) would be radiotherapy to the breast. The most aggressive option would be to pursue both modalities.  Of note, I discussed the data from the [employer] al trial in the [place] of Medicine. She understands that tamoxifen compared to radiation plus  tamoxifen demonstrated no survival benefit among the women in this study. The women were 11 years or older with stage I estrogen receptor positive breast cancer. Based on this study, I told the patient that her overall life expectancy should not be affected by adding radiotherapy to antiestrogen medication. She understands that the main benefit of  adding radiotherapy to anti estrogen therapy would be a very small but measurable local control benefit (risk of local recurrence to be lowered from ~10% --> ~2% over a decade).  We discussed the fact that radiotherapy only provides a local control benefit while anti-estrogen pills provide systemic coverage. That being said, the risk of systemic failure is relatively low with her type of breast cancer.  She would like to think about the side effects of antiestrogen therapy and radiation therapy before making her decision.   We discussed the risks benefits and side effects of radiotherapy. She  understands that the side effects would likely include some skin irritation and fatigue during the weeks of radiation. There is a risk of late effects which include but are not necessarily limited to cosmetic changes and rare lung toxicity. I would anticipate delivering approximately 3-4 weeks of radiotherapy.  After a thorough discussion, the patient would like to think about her options. I look forward to participating in the patient's care.  I will see her back after surgery (appt scheduled today) to discuss further. __________________________________________   Eppie Gibson, MD  This document serves as a record of services personally performed by Eppie Gibson, MD. It was created on her behalf by Rae Lips, a trained medical scribe. The creation of this record is based on the scribe's personal observations and the provider's statements to them. This document has been checked and approved by the attending provider.

## 2018-07-07 ENCOUNTER — Encounter: Payer: Self-pay | Admitting: Radiation Oncology

## 2018-07-07 ENCOUNTER — Ambulatory Visit
Admission: RE | Admit: 2018-07-07 | Discharge: 2018-07-07 | Disposition: A | Discharge status: Home / Self Care | Payer: Medicare Other | Source: Ambulatory Visit | Attending: Radiation Oncology | Admitting: Radiation Oncology

## 2018-07-07 ENCOUNTER — Ambulatory Visit: Payer: Medicare Other

## 2018-07-07 ENCOUNTER — Ambulatory Visit: Payer: Medicare Other | Admitting: Radiation Oncology

## 2018-07-07 ENCOUNTER — Other Ambulatory Visit: Payer: Self-pay

## 2018-07-07 DIAGNOSIS — Z170 Estrogen receptor positive status [ER+]: Principal | ICD-10-CM

## 2018-07-07 DIAGNOSIS — C50111 Malignant neoplasm of central portion of right female breast: Secondary | ICD-10-CM | POA: Diagnosis not present

## 2018-07-07 DIAGNOSIS — C50811 Malignant neoplasm of overlapping sites of right female breast: Secondary | ICD-10-CM

## 2018-07-09 ENCOUNTER — Encounter (HOSPITAL_COMMUNITY)
Admission: RE | Admit: 2018-07-09 | Discharge: 2018-07-09 | Disposition: A | Discharge status: Home / Self Care | Payer: Medicare Other | Source: Ambulatory Visit | Attending: Surgery | Admitting: Surgery

## 2018-07-09 ENCOUNTER — Ambulatory Visit
Admission: RE | Admit: 2018-07-09 | Discharge: 2018-07-09 | Disposition: A | Discharge status: Home / Self Care | Payer: Medicare Other | Source: Ambulatory Visit | Attending: Surgery | Admitting: Surgery

## 2018-07-09 ENCOUNTER — Encounter (HOSPITAL_COMMUNITY): Payer: Self-pay

## 2018-07-09 ENCOUNTER — Other Ambulatory Visit: Payer: Self-pay

## 2018-07-09 DIAGNOSIS — Z170 Estrogen receptor positive status [ER+]: Secondary | ICD-10-CM | POA: Diagnosis not present

## 2018-07-09 DIAGNOSIS — C50911 Malignant neoplasm of unspecified site of right female breast: Secondary | ICD-10-CM

## 2018-07-09 DIAGNOSIS — Z853 Personal history of malignant neoplasm of breast: Secondary | ICD-10-CM

## 2018-07-09 DIAGNOSIS — R001 Bradycardia, unspecified: Secondary | ICD-10-CM | POA: Insufficient documentation

## 2018-07-09 DIAGNOSIS — C50811 Malignant neoplasm of overlapping sites of right female breast: Secondary | ICD-10-CM | POA: Diagnosis not present

## 2018-07-09 DIAGNOSIS — Z01818 Encounter for other preprocedural examination: Secondary | ICD-10-CM | POA: Insufficient documentation

## 2018-07-09 DIAGNOSIS — C50211 Malignant neoplasm of upper-inner quadrant of right female breast: Secondary | ICD-10-CM | POA: Diagnosis not present

## 2018-07-09 HISTORY — DX: Malignant (primary) neoplasm, unspecified: C80.1

## 2018-07-09 LAB — CBC
HCT: 41.5 % (ref 36.0–46.0)
HEMOGLOBIN: 12.3 g/dL (ref 12.0–15.0)
MCH: 25.9 pg — AB (ref 26.0–34.0)
MCHC: 29.6 g/dL — ABNORMAL LOW (ref 30.0–36.0)
MCV: 87.6 fL (ref 80.0–100.0)
Platelets: 281 10*3/uL (ref 150–400)
RBC: 4.74 MIL/uL (ref 3.87–5.11)
RDW: 13.6 % (ref 11.5–15.5)
WBC: 5 10*3/uL (ref 4.0–10.5)
nRBC: 0 % (ref 0.0–0.2)

## 2018-07-09 LAB — BASIC METABOLIC PANEL
ANION GAP: 7 (ref 5–15)
BUN: 9 mg/dL (ref 8–23)
CO2: 25 mmol/L (ref 22–32)
Calcium: 9.3 mg/dL (ref 8.9–10.3)
Chloride: 109 mmol/L (ref 98–111)
Creatinine, Ser: 1.01 mg/dL — ABNORMAL HIGH (ref 0.44–1.00)
GFR calc Af Amer: >60 mL/min (ref >60)
GFR, EST NON AFRICAN AMERICAN: 56 mL/min — AB (ref >60)
GLUCOSE: 101 mg/dL — AB (ref 70–99)
POTASSIUM: 4.1 mmol/L (ref 3.5–5.1)
Sodium: 141 mmol/L (ref 135–145)

## 2018-07-09 MED ORDER — CHLORHEXIDINE GLUCONATE CLOTH 2 % EX PADS: 6.0000 | MEDICATED_PAD | Freq: Once | CUTANEOUS | Status: DC

## 2018-07-09 NOTE — Pre-Procedure Instructions (Signed)
SHARY LAMOS  07/09/2018      [place], [place] - [address] [place] [address] Phone: [phone] Fax: [phone]    Your procedure is scheduled on July 12, 2018.  Report to [facility] Admitting at 530 AM.  Call this number if you have problems the morning of surgery:  [phone]   Remember:  Do not eat or drink after midnight.  You may drink clear liquids until 4:30 AM the morning of surgery.  Clear liquids allowed are: Water, Juice (non-citric and without pulp), Clear Tea, Black Coffee only and Gatorade    Take these medicines the morning of surgery with A SIP OF WATER -none  Follow your surgeon's instructions on when to hold/resume aspirin.  If no instructions were given call the office to determine how they would like to you take aspirin  Beginning now, STOP taking any Aleve, Naproxen, Ibuprofen, Motrin, Advil, Goody's, BC's, all herbal medications, fish oil, and all vitamins    Do not wear jewelry, make-up or nail polish.  Do not wear lotions, powders, or perfumes, or deodorant.  Do not shave 48 hours prior to surgery.    Do not bring valuables to the hospital.  [facility] is not responsible for any belongings or valuables.  Contacts, dentures or bridgework may not be worn into surgery.  Leave your suitcase in the car.  After surgery it may be brought to your room.  For patients admitted to the hospital, discharge time will be determined by your treatment team.  Patients discharged the day of surgery will not be allowed to drive home.    [place]- Preparing For Surgery  Before surgery, you can play an important role. Because skin is not sterile, your skin needs to be as free of germs as possible. You can reduce the number of germs on your skin by washing with CHG (chlorahexidine gluconate) Soap before surgery.  CHG is an antiseptic cleaner which kills germs and bonds with the skin to  continue killing germs even after washing.    Oral Hygiene is also important to reduce your risk of infection.  Remember - BRUSH YOUR TEETH THE MORNING OF SURGERY WITH YOUR REGULAR TOOTHPASTE  Please do not use if you have an allergy to CHG or antibacterial soaps. If your skin becomes reddened/irritated stop using the CHG.  Do not shave (including legs and underarms) for at least 48 hours prior to first CHG shower. It is OK to shave your face.  Please follow these instructions carefully.   1. Shower the NIGHT BEFORE SURGERY and the MORNING OF SURGERY with CHG.   2. If you chose to wash your hair, wash your hair first as usual with your normal shampoo.  3. After you shampoo, rinse your hair and body thoroughly to remove the shampoo.  4. Use CHG as you would any other liquid soap. You can apply CHG directly to the skin and wash gently with a scrungie or a clean washcloth.   5. Apply the CHG Soap to your body ONLY FROM THE NECK DOWN.  Do not use on open wounds or open sores. Avoid contact with your eyes, ears, mouth and genitals (private parts). Wash Face and genitals (private parts)  with your normal soap.  6. Wash thoroughly, paying special attention to the area where your surgery will be performed.  7. Thoroughly rinse your body with warm water from  the neck down.  8. DO NOT shower/wash with your normal soap after using and rinsing off the CHG Soap.  9. Pat yourself dry with a CLEAN TOWEL.  10. Wear CLEAN PAJAMAS to bed the night before surgery, wear comfortable clothes the morning of surgery  11. Place CLEAN SHEETS on your bed the night of your first shower and DO NOT SLEEP WITH PETS.  Day of Surgery:  Do not apply any deodorants/lotions.  Please wear clean clothes to the hospital/surgery center.   Remember to brush your teeth WITH YOUR REGULAR TOOTHPASTE.

## 2018-07-09 NOTE — Progress Notes (Addendum)
PCP: Daiva Eves, MD  Cardiologist: pt denies  EKG: 07/09/18 in EPIC  Stress test: pt denies  ECHO: pt denies  Cardiac Cath: pt denies  Chest x-ray: pt denies past year, no recent respiratory infections/complications  Patient holding aspirin 81 mg since 07/08/18 per MD instructions

## 2018-07-10 ENCOUNTER — Encounter: Payer: Self-pay | Admitting: Radiation Oncology

## 2018-07-10 DIAGNOSIS — C50111 Malignant neoplasm of central portion of right female breast: Secondary | ICD-10-CM | POA: Insufficient documentation

## 2018-07-10 DIAGNOSIS — Z170 Estrogen receptor positive status [ER+]: Principal | ICD-10-CM

## 2018-07-11 NOTE — H&P (Signed)
Emily Bell Documented: 06/25/2018 9:52 AM Location: [facility] Patient #: [id number] DOB: 18-Jan-1946 Married / Language: English / Race: Black or African American Female   History of Present Illness ([name] [name] MD; 06/25/2018 10:31 AM) The patient is a 72 year old female who presents with breast cancer. This is a pleasant 72 year old female referred by [name] after the recent diagnosis of a right breast cancer. She was actually having pain around the right areola and in the left breast when she had screening mammograms performed. This showed an abnormality at 3 o'clock position of the right breast. She has since had an ultrasound showing the mass is approximately 1.1 cm in size. It is 1 cm from the nipple. She underwent a stereotactic biopsy which showed both invasive and in situ mammary carcinoma. It is strongly ER and PR positive, HER-2 negative. Ultrasound of the axilla was unremarkable. There were no abnormalities in the left breast. She denies nipple discharge. She has no previous problems regarding her breasts. There is no immediate family history of breast cancer. She is otherwise healthy without complaints.   Past Surgical History [name], CMA; 06/25/2018 9:53 AM) No pertinent past surgical history   Diagnostic Studies History [name], CMA; 06/25/2018 9:53 AM) Colonoscopy  1-5 years ago Pap Smear  1-5 years ago  Allergies [name], CMA; 06/25/2018 9:56 AM) No Known Drug Allergies [06/25/2018]:  Medication History [name], CMA; 06/25/2018 9:57 AM) Alendronate Sodium (70MG Tablet, Oral) Active. Medications Reconciled  Social History [name], CMA; 06/25/2018 9:53 AM) Caffeine use  Coffee. No alcohol use  No drug use  Tobacco use  Never smoker.  Family History [name], CMA; 06/25/2018 9:53 AM) Alcohol Abuse  Brother. Cancer  Mother. Depression  Mother. Hypertension   Mother. Respiratory Condition  Brother.  Pregnancy / Birth History [name], CMA; 06/25/2018 9:53 AM) Maternal age  35-20 Para  3  Other Problems [name], CMA; 06/25/2018 9:53 AM) Other disease, cancer, significant illness     Review of Systems [name] CMA; 06/25/2018 9:53 AM) General Not Present- Appetite Loss, Chills, Fatigue, Fever, Night Sweats, Weight Gain and Weight Loss. HEENT Present- Wears glasses/contact lenses. Not Present- Earache, Hearing Loss, Hoarseness, Nose Bleed, Oral Ulcers, Ringing in the Ears, Seasonal Allergies, Sinus Pain, Sore Throat, Visual Disturbances and Yellow Eyes. Gastrointestinal Not Present- Abdominal Pain, Bloating, Bloody Stool, Change in Bowel Habits, Chronic diarrhea, Constipation, Difficulty Swallowing, Excessive gas, Gets full quickly at meals, Hemorrhoids, Indigestion, Nausea, Rectal Pain and Vomiting. Psychiatric Not Present- Anxiety, Bipolar, Change in Sleep Pattern, Depression, Fearful and Frequent crying. Endocrine Not Present- Cold Intolerance, Excessive Hunger, Hair Changes, Heat Intolerance, Hot flashes and New Diabetes.  Vitals ([name] CMA; 06/25/2018 9:55 AM) 06/25/2018 9:55 AM Weight: 149 lb Height: 65in Body Surface Area: 1.75 m Body Mass Index: 24.79 kg/m  Temp.: 98.64F(Oral)  Pulse: 78 (Regular)  BP: 130/78 (Sitting, Left Arm, Standard)       Physical Exam ([name] [name] MD; 06/25/2018 10:32 AM) General Mental Status-Alert. General Appearance-Consistent with stated age. Hydration-Well hydrated. Voice-Normal.  Head and Neck Head-normocephalic, atraumatic with no lesions or palpable masses. Trachea-midline. Thyroid Gland Characteristics - normal size and consistency.  Eye Eyeball - Bilateral-Extraocular movements intact. Sclera/Conjunctiva - Bilateral-No scleral icterus.  Chest and Lung Exam Chest and lung exam reveals -quiet, even and easy  respiratory effort with no use of accessory muscles and on auscultation, normal breath sounds, no adventitious sounds and normal vocal resonance. Inspection Chest  Wall - Normal. Back - normal.  Breast Breast - Left-Symmetric, Non Tender, No Biopsy scars, no Dimpling, No Inflammation, No Lumpectomy scars, No Mastectomy scars, No Peau d' Orange. Breast - Right-Symmetric, Non Tender, No Biopsy scars, no Dimpling, No Inflammation, No Lumpectomy scars, No Mastectomy scars, No Peau d' Orange. Note: There is extensive bruising and a mild hematoma at the 3 o'clock position of the right breast. I cannot palpate a specific mass secondary to this. There are no other abnormalities in either breast and no axillary adenopathy.   Cardiovascular Cardiovascular examination reveals -normal heart sounds, regular rate and rhythm with no murmurs and normal pedal pulses bilaterally.  Abdomen Inspection Inspection of the abdomen reveals - No Hernias. Skin - Scar - no surgical scars. Palpation/Percussion Palpation and Percussion of the abdomen reveal - Soft, Non Tender, No Rebound tenderness, No Rigidity (guarding) and No hepatosplenomegaly. Auscultation Auscultation of the abdomen reveals - Bowel sounds normal.  Neurologic Neurologic evaluation reveals -alert and oriented x 3 with no impairment of recent or remote memory. Mental Status-Normal.  Musculoskeletal Normal Exam - Left-Upper Extremity Strength Normal and Lower Extremity Strength Normal. Normal Exam - Right-Upper Extremity Strength Normal and Lower Extremity Strength Normal.  Lymphatic Head & Neck  General Head & Neck Lymphatics: Bilateral - Description - Normal. Axillary  General Axillary Region: Bilateral - Description - Normal. Tenderness - Non Tender. Femoral & Inguinal - Did not examine.    Assessment & Plan (Karyl Sharrar [name] MD; 06/25/2018 10:34 AM) BREAST CANCER, RIGHT (C50.911) Impression: I have reviewed the  patient's mammograms, ultrasound, and pathology results. Again, this is a 1.1 cm right breast cancer which is ER/PR positive. I discussed the diagnosis with the patient and her husband. I gave him a copy of the pathology results. We discussed breast conservation versus mastectomy. We discussed postoperative radiation with breast conservation. I believe she is a candidate for a radioactive seed guided right breast partial mastectomy and sentinel node biopsy. She is interested in breast conservation. We discussed the risk of surgery. These risks include but are not limited to bleeding, infection, the need for further surgery if the margins are positive or lymph nodes are positive, injury to surrounding structures, cardiopulmonary issues, postoperative recovery, etc. Surgery will be scheduled. We will also refer to medical and radiation oncology

## 2018-07-11 NOTE — Anesthesia Preprocedure Evaluation (Addendum)
Anesthesia Evaluation  Patient identified by MRN, date of birth, ID band Patient awake    Reviewed: Allergy & Precautions, NPO status , Patient's Chart, lab work & pertinent test results  History of Anesthesia Complications Negative for: history of anesthetic complications  Airway Mallampati: II  TM Distance: >3 FB Neck ROM: Full    Dental no notable dental hx. (+) Dental Advisory Given   Pulmonary neg pulmonary ROS,    Pulmonary exam normal        Cardiovascular negative cardio ROS Normal cardiovascular exam     Neuro/Psych negative neurological ROS     GI/Hepatic negative GI ROS, Neg liver ROS,   Endo/Other  negative endocrine ROS  Renal/GU negative Renal ROS     Musculoskeletal negative musculoskeletal ROS (+)   Abdominal   Peds  Hematology negative hematology ROS (+)   Anesthesia Other Findings Day of surgery medications reviewed with the patient.  Reproductive/Obstetrics                            Anesthesia Physical Anesthesia Plan  ASA: II  Anesthesia Plan: General   Post-op Pain Management: GA combined w/ Regional for post-op pain   Induction: Intravenous  PONV Risk Score and Plan: 4 or greater and Ondansetron, Dexamethasone, Scopolamine patch - Pre-op and Diphenhydramine  Airway Management Planned: LMA  Additional Equipment:   Intra-op Plan:   Post-operative Plan: Extubation in OR  Informed Consent: I have reviewed the patients History and Physical, chart, labs and discussed the procedure including the risks, benefits and alternatives for the proposed anesthesia with the patient or authorized representative who has indicated his/her understanding and acceptance.   Dental advisory given  Plan Discussed with: CRNA and Anesthesiologist  Anesthesia Plan Comments:        Anesthesia Quick Evaluation

## 2018-07-12 ENCOUNTER — Ambulatory Visit (HOSPITAL_COMMUNITY): Payer: Medicare Other | Admitting: Anesthesiology

## 2018-07-12 ENCOUNTER — Ambulatory Visit (HOSPITAL_COMMUNITY)
Admission: RE | Admit: 2018-07-12 | Discharge: 2018-07-12 | Disposition: A | Discharge status: Home / Self Care | Payer: Medicare Other | Source: Ambulatory Visit | Attending: Surgery | Admitting: Surgery

## 2018-07-12 ENCOUNTER — Encounter (HOSPITAL_COMMUNITY): Payer: Self-pay | Admitting: Critical Care Medicine

## 2018-07-12 ENCOUNTER — Ambulatory Visit
Admission: RE | Admit: 2018-07-12 | Discharge: 2018-07-12 | Disposition: A | Discharge status: Home / Self Care | Payer: Medicare Other | Source: Ambulatory Visit | Attending: Surgery | Admitting: Surgery

## 2018-07-12 ENCOUNTER — Encounter (HOSPITAL_COMMUNITY)
Admission: RE | Disposition: A | Discharge status: Home / Self Care | Payer: Self-pay | Source: Ambulatory Visit | Attending: Surgery

## 2018-07-12 DIAGNOSIS — Z170 Estrogen receptor positive status [ER+]: Secondary | ICD-10-CM | POA: Insufficient documentation

## 2018-07-12 DIAGNOSIS — C50211 Malignant neoplasm of upper-inner quadrant of right female breast: Secondary | ICD-10-CM | POA: Diagnosis not present

## 2018-07-12 DIAGNOSIS — C50111 Malignant neoplasm of central portion of right female breast: Secondary | ICD-10-CM | POA: Diagnosis not present

## 2018-07-12 DIAGNOSIS — Z853 Personal history of malignant neoplasm of breast: Secondary | ICD-10-CM

## 2018-07-12 DIAGNOSIS — R928 Other abnormal and inconclusive findings on diagnostic imaging of breast: Secondary | ICD-10-CM | POA: Diagnosis not present

## 2018-07-12 DIAGNOSIS — G8918 Other acute postprocedural pain: Secondary | ICD-10-CM | POA: Diagnosis not present

## 2018-07-12 DIAGNOSIS — C50911 Malignant neoplasm of unspecified site of right female breast: Secondary | ICD-10-CM | POA: Diagnosis not present

## 2018-07-12 HISTORY — PX: BREAST LUMPECTOMY WITH RADIOACTIVE SEED AND SENTINEL LYMPH NODE BIOPSY: SHX6550

## 2018-07-12 HISTORY — PX: BREAST LUMPECTOMY: SHX2

## 2018-07-12 SURGERY — BREAST LUMPECTOMY WITH RADIOACTIVE SEED AND SENTINEL LYMPH NODE BIOPSY
Anesthesia: General | Site: Breast | Laterality: Right

## 2018-07-12 MED ORDER — SCOPOLAMINE 1 MG/3DAYS TD PT72
MEDICATED_PATCH | TRANSDERMAL | Status: DC | PRN
  Administered 2018-07-12: 1 via TRANSDERMAL

## 2018-07-12 MED ORDER — BUPIVACAINE-EPINEPHRINE (PF) 0.25% -1:200000 IJ SOLN
INTRAMUSCULAR | Status: AC
  Filled 2018-07-12: qty 30

## 2018-07-12 MED ORDER — ACETAMINOPHEN 500 MG PO TABS
1000.0000 mg | ORAL_TABLET | ORAL | Status: AC
  Administered 2018-07-12: 1000 mg via ORAL
  Filled 2018-07-12: qty 2

## 2018-07-12 MED ORDER — 0.9 % SODIUM CHLORIDE (POUR BTL) OPTIME
TOPICAL | Status: DC | PRN
  Administered 2018-07-12: 1000 mL

## 2018-07-12 MED ORDER — METHYLENE BLUE 0.5 % INJ SOLN
INTRAVENOUS | Status: AC
  Filled 2018-07-12: qty 10

## 2018-07-12 MED ORDER — TECHNETIUM TC 99M SULFUR COLLOID FILTERED
1.0000 | Freq: Once | INTRAVENOUS | Status: AC | PRN
  Administered 2018-07-12: 1 via INTRADERMAL

## 2018-07-12 MED ORDER — BUPIVACAINE-EPINEPHRINE (PF) 0.5% -1:200000 IJ SOLN
INTRAMUSCULAR | Status: DC | PRN
  Administered 2018-07-12: 30 mL

## 2018-07-12 MED ORDER — MIDAZOLAM HCL 5 MG/5ML IJ SOLN
INTRAMUSCULAR | Status: DC | PRN
  Administered 2018-07-12 (×2): 1 mg via INTRAVENOUS

## 2018-07-12 MED ORDER — PROPOFOL 10 MG/ML IV BOLUS
INTRAVENOUS | Status: DC | PRN
  Administered 2018-07-12: 160 mg via INTRAVENOUS

## 2018-07-12 MED ORDER — MIDAZOLAM HCL 2 MG/2ML IJ SOLN
INTRAMUSCULAR | Status: AC
  Filled 2018-07-12: qty 2

## 2018-07-12 MED ORDER — PROPOFOL 10 MG/ML IV BOLUS
INTRAVENOUS | Status: AC
  Filled 2018-07-12: qty 20

## 2018-07-12 MED ORDER — GABAPENTIN 300 MG PO CAPS
300.0000 mg | ORAL_CAPSULE | ORAL | Status: AC
  Administered 2018-07-12: 300 mg via ORAL
  Filled 2018-07-12: qty 1

## 2018-07-12 MED ORDER — DEXAMETHASONE SODIUM PHOSPHATE 10 MG/ML IJ SOLN
INTRAMUSCULAR | Status: DC | PRN
  Administered 2018-07-12: 4 mg via INTRAVENOUS

## 2018-07-12 MED ORDER — LACTATED RINGERS IV SOLN
INTRAVENOUS | Status: DC | PRN
  Administered 2018-07-12: 07:00:00 via INTRAVENOUS

## 2018-07-12 MED ORDER — FENTANYL CITRATE (PF) 250 MCG/5ML IJ SOLN
INTRAMUSCULAR | Status: AC
  Filled 2018-07-12: qty 5

## 2018-07-12 MED ORDER — FENTANYL CITRATE (PF) 250 MCG/5ML IJ SOLN
INTRAMUSCULAR | Status: DC | PRN
  Administered 2018-07-12 (×2): 25 ug via INTRAVENOUS
  Administered 2018-07-12: 50 ug via INTRAVENOUS
  Administered 2018-07-12: 25 ug via INTRAVENOUS

## 2018-07-12 MED ORDER — SODIUM CHLORIDE (PF) 0.9 % IJ SOLN
INTRAMUSCULAR | Status: AC
  Filled 2018-07-12: qty 10

## 2018-07-12 MED ORDER — CEFAZOLIN SODIUM-DEXTROSE 2-4 GM/100ML-% IV SOLN
2.0000 g | INTRAVENOUS | Status: AC
  Administered 2018-07-12: 2 g via INTRAVENOUS
  Filled 2018-07-12: qty 100

## 2018-07-12 MED ORDER — DIPHENHYDRAMINE HCL 50 MG/ML IJ SOLN
INTRAMUSCULAR | Status: DC | PRN
  Administered 2018-07-12: 12.5 mg via INTRAVENOUS

## 2018-07-12 MED ORDER — LIDOCAINE 2% (20 MG/ML) 5 ML SYRINGE
INTRAMUSCULAR | Status: DC | PRN
  Administered 2018-07-12: 40 mg via INTRAVENOUS

## 2018-07-12 MED ORDER — ONDANSETRON HCL 4 MG/2ML IJ SOLN
INTRAMUSCULAR | Status: DC | PRN
  Administered 2018-07-12: 4 mg via INTRAVENOUS

## 2018-07-12 MED ORDER — BUPIVACAINE-EPINEPHRINE 0.25% -1:200000 IJ SOLN
INTRAMUSCULAR | Status: DC | PRN
  Administered 2018-07-12: 30 mL

## 2018-07-12 MED ORDER — TRAMADOL HCL 50 MG PO TABS: 50.0000 mg | ORAL_TABLET | Freq: Four times a day (QID) | ORAL | 0 refills | Status: DC | PRN

## 2018-07-12 SURGICAL SUPPLY — 53 items
ADH SKN CLS APL DERMABOND .7 (GAUZE/BANDAGES/DRESSINGS) ×1
APPLIER CLIP 9.375 MED OPEN (MISCELLANEOUS) ×3
APR CLP MED 9.3 20 MLT OPN (MISCELLANEOUS) ×1
BINDER BREAST LRG (GAUZE/BANDAGES/DRESSINGS) ×2 IMPLANT
BINDER BREAST XLRG (GAUZE/BANDAGES/DRESSINGS) IMPLANT
BLADE SURG 15 STRL LF DISP TIS (BLADE) ×1 IMPLANT
BLADE SURG 15 STRL SS (BLADE) ×3
CANISTER SUCT 3000ML PPV (MISCELLANEOUS) ×3 IMPLANT
CHLORAPREP W/TINT 26ML (MISCELLANEOUS) ×3 IMPLANT
CLIP APPLIE 9.375 MED OPEN (MISCELLANEOUS) ×1 IMPLANT
CONT SPEC 4OZ CLIKSEAL STRL BL (MISCELLANEOUS) ×2 IMPLANT
COVER PROBE W GEL 5X96 (DRAPES) ×3 IMPLANT
COVER SURGICAL LIGHT HANDLE (MISCELLANEOUS) ×3 IMPLANT
COVER WAND RF STERILE (DRAPES) ×1 IMPLANT
DERMABOND ADVANCED (GAUZE/BANDAGES/DRESSINGS) ×2
DERMABOND ADVANCED .7 DNX12 (GAUZE/BANDAGES/DRESSINGS) ×1 IMPLANT
DEVICE DUBIN SPECIMEN MAMMOGRA (MISCELLANEOUS) ×1 IMPLANT
DRAPE CHEST BREAST 15X10 FENES (DRAPES) ×3 IMPLANT
DRAPE UTILITY XL STRL (DRAPES) ×3 IMPLANT
ELECT CAUTERY BLADE 6.4 (BLADE) ×3 IMPLANT
ELECT REM PT RETURN 9FT ADLT (ELECTROSURGICAL) ×3
ELECTRODE REM PT RTRN 9FT ADLT (ELECTROSURGICAL) ×1 IMPLANT
GAUZE SPONGE 4X4 12PLY STRL LF (GAUZE/BANDAGES/DRESSINGS) ×2 IMPLANT
GLOVE BIOGEL PI IND STRL 8 (GLOVE) IMPLANT
GLOVE BIOGEL PI INDICATOR 8 (GLOVE) ×2
GLOVE SURG SIGNA 7.5 PF LTX (GLOVE) ×3 IMPLANT
GOWN STRL REUS W/ TWL LRG LVL3 (GOWN DISPOSABLE) ×1 IMPLANT
GOWN STRL REUS W/ TWL XL LVL3 (GOWN DISPOSABLE) ×1 IMPLANT
GOWN STRL REUS W/TWL LRG LVL3 (GOWN DISPOSABLE) ×3
GOWN STRL REUS W/TWL XL LVL3 (GOWN DISPOSABLE) ×3
KIT BASIN OR (CUSTOM PROCEDURE TRAY) ×3 IMPLANT
KIT MARKER MARGIN INK (KITS) ×3 IMPLANT
NDL FILTER BLUNT 18X1 1/2 (NEEDLE) IMPLANT
NDL HYPO 25GX1X1/2 BEV (NEEDLE) ×1 IMPLANT
NDL SAFETY ECLIPSE 18X1.5 (NEEDLE) IMPLANT
NEEDLE FILTER BLUNT 18X 1/2SAF (NEEDLE)
NEEDLE FILTER BLUNT 18X1 1/2 (NEEDLE) IMPLANT
NEEDLE HYPO 18GX1.5 SHARP (NEEDLE)
NEEDLE HYPO 25GX1X1/2 BEV (NEEDLE) ×3 IMPLANT
NS IRRIG 1000ML POUR BTL (IV SOLUTION) ×3 IMPLANT
PACK SURGICAL SETUP 50X90 (CUSTOM PROCEDURE TRAY) ×3 IMPLANT
PENCIL BUTTON HOLSTER BLD 10FT (ELECTRODE) ×3 IMPLANT
SPONGE LAP 18X18 X RAY DECT (DISPOSABLE) ×3 IMPLANT
SUT MNCRL AB 4-0 PS2 18 (SUTURE) ×3 IMPLANT
SUT VIC AB 3-0 SH 18 (SUTURE) ×3 IMPLANT
SUT VIC AB 3-0 SH 8-18 (SUTURE) ×2 IMPLANT
SYR BULB 3OZ (MISCELLANEOUS) ×3 IMPLANT
SYR CONTROL 10ML LL (SYRINGE) ×3 IMPLANT
TOWEL OR 17X24 6PK STRL BLUE (TOWEL DISPOSABLE) ×1 IMPLANT
TOWEL OR 17X26 10 PK STRL BLUE (TOWEL DISPOSABLE) ×3 IMPLANT
TUBE CONNECTING 12'X1/4 (SUCTIONS) ×1
TUBE CONNECTING 12X1/4 (SUCTIONS) ×2 IMPLANT
YANKAUER SUCT BULB TIP NO VENT (SUCTIONS) ×3 IMPLANT

## 2018-07-12 NOTE — Op Note (Signed)
RIGHT BREAST PARTIAL MASTECTOMY WITH RADIOACTIVE SEED AND DEEP RIGHT AXILLARY SENTINEL LYMPH NODE BIOPSY  Procedure Note  KAMMY KLETT 07/12/2018   Pre-op Diagnosis: RIGHT BREAST CANCER     Post-op Diagnosis: same  Procedure(s): RIGHT BREAST PARTIAL MASTECTOMY WITH RADIOACTIVE SEED AND DEEP RIGHT AXILLARY SENTINEL LYMPH NODE BIOPSY  Surgeon(s): Coralie Keens, MD  Anesthesia: General  Staff:  Circulator: Beryle Lathe, RN Scrub Person: Dollene Cleveland T  Estimated Blood Loss: Minimal               Specimens: sent to path  Indications: This is a 72 year old female with a right breast cancer.  She now presents for a radioactive seed guided lumpectomy and right axillary sentinel lymph node biopsy  Procedure: The patient was brought to the operating room and identified as the correct patient.  She was placed supine on the operating room table and general anesthesia was induced.  Her right breast and axilla were prepped and draped in the usual sterile fashion.  Using the neoprobe, I located the radioactive seed at the 3 o'clock position of the right breast adjacent to the areola.  I anesthetized the medial edge of the areola with Marcaine.  I then made an circumareolar elliptical incision that included skin.  I then dissected down to the breast tissue with the cautery.  I performed a partial mastectomy of the medial quadrant of the breast staying underneath the areola and widely around the radioactive seed with the aid of the neoprobe.  I took this all the way down to the chest wall.  I then completely excised the specimen staying widely around the seed.  I then marked all the margins with paint.  An x-ray was performed confirming that the seed and previous tissue marker were in the biopsy specimen.  The partial mastectomy specimen was then sent to pathology.  I achieved hemostasis with the cautery.  I then used the neoprobe to identify an area of increased uptake in the right  axilla.  I anesthetized the skin with Marcaine.  I made an incision with a scalpel and dissected into the deep axillary tissue with the aid of the neoprobe.  I then identified 2 sentinel lymph nodes which were excised.  No further uptake was identified in the nodal basin with the neoprobe.  I cannot palpate any enlarged lymph nodes.  Hemostasis appeared to be achieved.  I placed several surgical clips at the breast excision site for marker purposes.  I then closed both incisions with interrupted 3-0 Vicryl sutures and running 4-0 Monocryl sutures.  Dermabond was then applied.  The patient was then placed in a breast binder.  She tolerated the procedure well.  All the counts were correct at the end of the procedure.  She was then extubated in the operating room and taken in a stable condition to the recovery room.          Labib Cwynar A   Date: 07/12/2018  Time: 8:27 AM

## 2018-07-12 NOTE — Interval H&P Note (Signed)
History and Physical Interval Note:no change in H and P  07/12/2018 6:48 AM  Emily Bell  has presented today for surgery, with the diagnosis of RIGHT BREAST CANCER  The various methods of treatment have been discussed with the patient and family. After consideration of risks, benefits and other options for treatment, the patient has consented to  Procedure(s): RIGHT BREAST PARTIAL MASTECTOMY WITH RADIOACTIVE SEED AND SENTINEL LYMPH NODE BIOPSY (Right) as a surgical intervention .  The patient's history has been reviewed, patient examined, no change in status, stable for surgery.  I have reviewed the patient's chart and labs.  Questions were answered to the patient's satisfaction.     [name]

## 2018-07-12 NOTE — Transfer of Care (Signed)
Immediate Anesthesia Transfer of Care Note  Patient: Emily Bell  Procedure(s) Performed: RIGHT BREAST PARTIAL MASTECTOMY WITH RADIOACTIVE SEED AND SENTINEL LYMPH NODE BIOPSY (Right Breast)  Patient Location: PACU  Anesthesia Type:GA combined with regional for post-op pain  Level of Consciousness: awake, alert  and oriented  Airway & Oxygen Therapy: Patient Spontanous Breathing and Patient connected to nasal cannula oxygen  Post-op Assessment: Report given to RN, Post -op Vital signs reviewed and stable and Patient moving all extremities X 4  Post vital signs: Reviewed and stable  Last Vitals:  Vitals Value Taken Time  BP    Temp    Pulse 80 07/12/2018  8:34 AM  Resp 14 07/12/2018  8:34 AM  SpO2 100 % 07/12/2018  8:34 AM  Vitals shown include unvalidated device data.  Last Pain: There were no vitals filed for this visit.       Complications: No apparent anesthesia complications

## 2018-07-12 NOTE — Anesthesia Postprocedure Evaluation (Signed)
Anesthesia Post Note  Patient: Emily Bell  Procedure(s) Performed: RIGHT BREAST PARTIAL MASTECTOMY WITH RADIOACTIVE SEED AND SENTINEL LYMPH NODE BIOPSY (Right Breast)     Patient location during evaluation: PACU Anesthesia Type: General Level of consciousness: sedated Pain management: pain level controlled Vital Signs Assessment: post-procedure vital signs reviewed and stable Respiratory status: spontaneous breathing and respiratory function stable Cardiovascular status: stable Postop Assessment: no apparent nausea or vomiting Anesthetic complications: no    Last Vitals:  Vitals:   07/12/18 0900 07/12/18 0912  BP:  128/61  Pulse: 72   Resp: 14   Temp:    SpO2: 99% 100%    Last Pain:  Vitals:   07/12/18 0912  PainSc: 0-No pain                 [name] DANIEL

## 2018-07-12 NOTE — Anesthesia Procedure Notes (Signed)
Anesthesia Regional Block: Pectoralis block   Pre-Anesthetic Checklist: ,, timeout performed, Correct Patient, Correct Site, Correct Laterality, Correct Procedure, Correct Position, site marked, Risks and benefits discussed,  Surgical consent,  Pre-op evaluation,  At surgeon's request and post-op pain management  Laterality: Right  Prep: chloraprep       Needles:  Injection technique: Single-shot  Needle Type: Echogenic Stimulator Needle     Needle Length: 10cm  Needle Gauge: 21     Additional Needles:   Narrative:  Start time: 07/12/2018 6:51 AM End time: 07/12/2018 7:01 AM Injection made incrementally with aspirations every 5 mL.  Performed by: Personally

## 2018-07-12 NOTE — Anesthesia Procedure Notes (Signed)
Procedure Name: LMA Insertion Date/Time: 07/12/2018 7:30 AM Performed by: Wilburn Cornelia, CRNA Pre-anesthesia Checklist: Patient identified, Emergency Drugs available, Patient being monitored, Suction available and Timeout performed Patient Re-evaluated:Patient Re-evaluated prior to induction Oxygen Delivery Method: Circle system utilized Preoxygenation: Pre-oxygenation with 100% oxygen Induction Type: IV induction Ventilation: Mask ventilation without difficulty LMA: LMA inserted LMA Size: 4.0 Number of attempts: 1 Placement Confirmation: positive ETCO2 and breath sounds checked- equal and bilateral Tube secured with: Tape Dental Injury: Teeth and Oropharynx as per pre-operative assessment

## 2018-07-12 NOTE — Discharge Instructions (Signed)
[place] Office Phone Number [phone]  BREAST BIOPSY/ PARTIAL MASTECTOMY: POST OP INSTRUCTIONS  Always review your discharge instruction sheet given to you by the facility where your surgery was performed.  IF YOU HAVE DISABILITY OR FAMILY LEAVE FORMS, YOU MUST BRING THEM TO THE OFFICE FOR PROCESSING.  DO NOT GIVE THEM TO YOUR DOCTOR.  1. A prescription for pain medication may be given to you upon discharge.  Take your pain medication as prescribed, if needed.  If narcotic pain medicine is not needed, then you may take acetaminophen (Tylenol) or ibuprofen (Advil) as needed. 2. Take your usually prescribed medications unless otherwise directed 3. If you need a refill on your pain medication, please contact your pharmacy.  They will contact our office to request authorization.  Prescriptions will not be filled after 5pm or on week-ends. 4. You should eat very light the first 24 hours after surgery, such as soup, crackers, pudding, etc.  Resume your normal diet the day after surgery. 5. Most patients will experience some swelling and bruising in the breast.  Ice packs and a good support bra will help.  Swelling and bruising can take several days to resolve.  6. It is common to experience some constipation if taking pain medication after surgery.  Increasing fluid intake and taking a stool softener will usually help or prevent this problem from occurring.  A mild laxative (Milk of Magnesia or Miralax) should be taken according to package directions if there are no bowel movements after 48 hours. 7. Unless discharge instructions indicate otherwise, you may remove your bandages 24-48 hours after surgery, and you may shower at that time.  You may have steri-strips (small skin tapes) in place directly over the incision.  These strips should be left on the skin for 7-10 days.  If your surgeon used skin glue on the incision, you may shower in 24 hours.  The glue will flake off over the  next 2-3 weeks.  Any sutures or staples will be removed at the office during your follow-up visit. 8. ACTIVITIES:  You may resume regular daily activities (gradually increasing) beginning the next day.  Wearing a good support bra or sports bra minimizes pain and swelling.  You may have sexual intercourse when it is comfortable. a. You may drive when you no longer are taking prescription pain medication, you can comfortably wear a seatbelt, and you can safely maneuver your car and apply brakes. b. RETURN TO WORK:  ______________________________________________________________________________________ 9. You should see your doctor in the office for a follow-up appointment approximately two weeks after your surgery.  Your doctors nurse will typically make your follow-up appointment when she calls you with your pathology report.  Expect your pathology report 2-3 business days after your surgery.  You may call to check if you do not hear from [place] after three days. 10. OTHER INSTRUCTIONS:OK TO REMOVE THE BINDER AND START SHOWERING TOMORROW 11. NO VIGOROUS ACTIVITY FOR ONE WEEK 12. TYLENOL , IBUPROFEN, AND ICE PACK ALSO FOR PAIN _______________________________________________________________________________________________ _____________________________________________________________________________________________________________________________________ _____________________________________________________________________________________________________________________________________ _____________________________________________________________________________________________________________________________________  WHEN TO CALL YOUR DOCTOR: 1. Fever over 101.0 2. Nausea and/or vomiting. 3. Extreme swelling or bruising. 4. Continued bleeding from incision. 5. Increased pain, redness, or drainage from the incision.  The clinic staff is available to answer your questions during regular business hours.   Please dont hesitate to call and ask to speak to one of the nurses for clinical concerns.  If you have a medical emergency, go to the nearest emergency room  or call 911.  A surgeon from [facility] is always on call at the hospital.  For further questions, please visit [email or web address]

## 2018-07-13 ENCOUNTER — Encounter (HOSPITAL_COMMUNITY): Payer: Self-pay | Admitting: Surgery

## 2018-07-13 DIAGNOSIS — Z9011 Acquired absence of right breast and nipple: Secondary | ICD-10-CM | POA: Diagnosis not present

## 2018-07-13 DIAGNOSIS — Z483 Aftercare following surgery for neoplasm: Secondary | ICD-10-CM | POA: Diagnosis not present

## 2018-07-13 DIAGNOSIS — Z170 Estrogen receptor positive status [ER+]: Secondary | ICD-10-CM | POA: Diagnosis not present

## 2018-07-13 DIAGNOSIS — C50911 Malignant neoplasm of unspecified site of right female breast: Secondary | ICD-10-CM | POA: Diagnosis not present

## 2018-07-15 DIAGNOSIS — Z9011 Acquired absence of right breast and nipple: Secondary | ICD-10-CM | POA: Diagnosis not present

## 2018-07-15 DIAGNOSIS — Z483 Aftercare following surgery for neoplasm: Secondary | ICD-10-CM | POA: Diagnosis not present

## 2018-07-15 DIAGNOSIS — C50911 Malignant neoplasm of unspecified site of right female breast: Secondary | ICD-10-CM | POA: Diagnosis not present

## 2018-07-15 DIAGNOSIS — Z170 Estrogen receptor positive status [ER+]: Secondary | ICD-10-CM | POA: Diagnosis not present

## 2018-07-16 ENCOUNTER — Ambulatory Visit: Payer: Medicare Other

## 2018-07-16 DIAGNOSIS — C50911 Malignant neoplasm of unspecified site of right female breast: Secondary | ICD-10-CM | POA: Diagnosis not present

## 2018-07-16 DIAGNOSIS — Z170 Estrogen receptor positive status [ER+]: Secondary | ICD-10-CM | POA: Diagnosis not present

## 2018-07-16 DIAGNOSIS — Z483 Aftercare following surgery for neoplasm: Secondary | ICD-10-CM | POA: Diagnosis not present

## 2018-07-16 DIAGNOSIS — Z9011 Acquired absence of right breast and nipple: Secondary | ICD-10-CM | POA: Diagnosis not present

## 2018-07-19 DIAGNOSIS — Z170 Estrogen receptor positive status [ER+]: Secondary | ICD-10-CM | POA: Diagnosis not present

## 2018-07-19 DIAGNOSIS — Z483 Aftercare following surgery for neoplasm: Secondary | ICD-10-CM | POA: Diagnosis not present

## 2018-07-19 DIAGNOSIS — Z9011 Acquired absence of right breast and nipple: Secondary | ICD-10-CM | POA: Diagnosis not present

## 2018-07-19 DIAGNOSIS — C50911 Malignant neoplasm of unspecified site of right female breast: Secondary | ICD-10-CM | POA: Diagnosis not present

## 2018-07-21 DIAGNOSIS — Z170 Estrogen receptor positive status [ER+]: Secondary | ICD-10-CM | POA: Diagnosis not present

## 2018-07-21 DIAGNOSIS — Z483 Aftercare following surgery for neoplasm: Secondary | ICD-10-CM | POA: Diagnosis not present

## 2018-07-21 DIAGNOSIS — C50911 Malignant neoplasm of unspecified site of right female breast: Secondary | ICD-10-CM | POA: Diagnosis not present

## 2018-07-21 DIAGNOSIS — Z9011 Acquired absence of right breast and nipple: Secondary | ICD-10-CM | POA: Diagnosis not present

## 2018-07-23 ENCOUNTER — Other Ambulatory Visit: Payer: Self-pay

## 2018-07-23 DIAGNOSIS — Z170 Estrogen receptor positive status [ER+]: Principal | ICD-10-CM

## 2018-07-23 DIAGNOSIS — C50111 Malignant neoplasm of central portion of right female breast: Secondary | ICD-10-CM

## 2018-07-23 NOTE — Progress Notes (Signed)
[facility]  [phone] [phone]   ID: Emily Bell DOB: August 05, 1945  MR#: [id number]  OEV#:[id number]  Patient Care Team: Cyndi Bender, Hershal Coria as PCP - General (Physician Assistant) Tiffanni Scarfo, Virgie Dad, MD as Consulting Physician (Oncology) Coralie Keens, MD as Consulting Physician (General Surgery) Eppie Gibson, MD as Attending Physician (Radiation Oncology) OTHER MD:   CHIEF COMPLAINT: Estrogen receptor positive breast cancer   CURRENT TREATMENT: Anastrozole   HISTORY OF CURRENT ILLNESS: Emily Bell presented with focal pain in the retroareolar right breast as well as in the inferior left breast near the inframammary fold. She originally had a routine screening in 08/2018, but called and had her mammography pushed up. She underwent bilateral diagnostic mammography with tomography and bilateral breast ultrasonography at [facility] on 06/17/2018 showing: Breast Density Category B. In the lower slightly inner quadrant of the right breast there is an irregular mass which on the CC view appears to be associated with some distortion. The mass measures approximately 1 cm.  On physical exam, no discrete palpable masses are identified in the medial right breast. Ultrasound of the right breast at 3 o'clock, 1 cm from the nipple demonstrates an irregular hypoechoic mass measuring 1.1 x 0.5 x 0.7 cm. Ultrasound of the right axilla demonstrates multiple normal-appearing lymph nodes.   There are no suspicious findings seen at the site of focal pain in the anterior left breast. Ultrasound of the left breast at the site of pain demonstrates normal fibroglandular tissue. No masses or suspicious areas of shadowing are identified.   Accordingly on 06/21/2018 she proceeded to biopsy of the right breast area in question. The pathology from this procedure showed ([id number]): invasive mammary carcinoma, Grade II - III, and a mammary carcinoma in situ,  intermediate nuclear grade. E-cadherin is positive, consistent with a ductal phenotype. Prognostic indicators significant for: estrogen receptor, 90% positive and progesterone receptor, 90% positive, both with strong staining intensity. Proliferation marker Ki67 at 5%. HER2 negative by immunohistochemistry (1+).   On 07/12/2018 she underwent a right breast partial mastectomy. Pathology from this procedure ([id number]) shows an invasive ductal carcinoma, grade II/III, spanning 1.1 cm, and a ductal carcinoma in situ, low grade. Perineural invasion is identified. Ductal carcinoma in situ is focally 0.1 cm to the anterior margin. Invasive ductal carcinoma is focally less than 0.1 cm to the medial margin. Both sentinel right axillary lymph nodes biopsied were clear.  The patient's subsequent history is as detailed below.   INTERVAL HISTORY: [name] was evaluated in the breast cancer clinic on 07/26/2018 accompanied by her husband, Emily Bell.   Her case was also presented at the multidisciplinary breast cancer conference on 06/30/2018. At that time a preliminary plan was proposed: Breast conserving surgery, consideration of Oncotype, adjuvant radiation to be considered, antiestrogens   REVIEW OF SYSTEMS: [name] is doing well overall. There was pain in both breasts leading to the diagnostic mammography. She experienced no pain, bleeding, fever or drainage post surgery. The patient denies unusual headaches, visual changes, nausea, vomiting, stiff neck, dizziness, or gait imbalance. There has been no cough, phlegm production, or pleurisy, no chest pain or pressure, and no change in bowel or bladder habits. The patient denies fever, rash, bleeding, unexplained fatigue or unexplained weight loss. A detailed review of systems was otherwise entirely negative.   PAST MEDICAL HISTORY: Past Medical History:  Diagnosis Date  . Anemia   . Cancer [facility])    right breast  . Colon polyps   . Hypercholesteremia   .  Osteoporosis   . Vitamin B 12 deficiency   . Vitamin D deficiency      PAST SURGICAL HISTORY: Past Surgical History:  Procedure Laterality Date  . BREAST LUMPECTOMY WITH RADIOACTIVE SEED AND SENTINEL LYMPH NODE BIOPSY Right 07/12/2018   Procedure: RIGHT BREAST PARTIAL MASTECTOMY WITH RADIOACTIVE SEED AND SENTINEL LYMPH NODE BIOPSY;  Surgeon: Coralie Keens, MD;  Location: [facility];  Service: General;  Laterality: Right;  . COLONOSCOPY    . neg hx      FAMILY HISTORY: Family History  Problem Relation Age of Onset  . Hypertension Mother   . Stomach cancer Mother   . Cancer Cousin        Breast  . Colon cancer Neg Hx    Steffani's father died from pneumonia at age 59. Patients' mother died from "stomach cancer in the lining of her intestines" at age 88. The patient has 2 brothers. The patient has one first cousin with breast cancer. Patient denies anyone in her family having ovarian, prostate or pancreatic cancer.    GYNECOLOGIC HISTORY:  No LMP recorded. Patient is postmenopausal. 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 Before she was a Quarry manager, she worked at [employer]. Her husband, Emily Bell, is retired from Therapist, art.  The patient has 3 children: Emily Bell, and Emily Bell. Emily Bell is 57, lives in [place], and is retired from the [employer]. Emily Bell is 8, lives in [place], [place], and is a Media planner. Emily Bell is 87, lives in [place], [place], and drives long distances. Emily Bell has 5 grandchildren, 4 of them in their 75s, 1 (in [place]) 72 years old.  No great grandchildren. She attends [employer].   ADVANCED DIRECTIVES:    HEALTH MAINTENANCE: Social History   Tobacco Use  . Smoking status: Never Smoker  . Smokeless tobacco: Never Used  Substance Use  Topics  . Alcohol use: No  . Drug use: No    Colonoscopy: March 2012, [facility]  PAP: 4+ years ago at [facility] density: next scheduled for 08/12/2018  No Known Allergies  Current Outpatient Medications  Medication Sig Dispense Refill  . alendronate (FOSAMAX) 70 MG tablet Take 70 mg by mouth every Monday. Take with a full glass of water on an empty stomach.     Marland Kitchen aspirin 81 MG tablet Take 81 mg by mouth daily.    . Calcium Carb-Cholecalciferol (CALCIUM 600 + D PO) Take 1 tablet by mouth daily.    . Cholecalciferol (VITAMIN D3) 2000 UNITS TABS Take 2,000 Units by mouth daily.     . Coenzyme Q10 (CO Q 10 PO) Take 200 mg by mouth daily.     . magnesium oxide (MAG-OX) 400 MG tablet Take 400 mg by mouth daily.    . traMADol (ULTRAM) 50 MG tablet Take 1 tablet (50 mg total) by mouth every 6 (six) hours as needed for moderate pain. 30 tablet 0  . vitamin B-12 (CYANOCOBALAMIN) 1000 MCG tablet Take 1,000 mcg by mouth daily.     No current facility-administered medications for this visit.      OBJECTIVE: Older African-American woman who appears younger than stated age  63:   07/26/18 1551  BP: 139/74  Pulse: (!) 58  Resp: 17  Temp: 98.8  F (37.1 C)  SpO2: 100%     Body mass index is 24.84 kg/m.   Wt Readings from Last 3 Encounters:  07/26/18 149 lb 4.8 oz (67.7 kg)  07/09/18 148 lb 11.2 oz (67.4 kg)  07/07/18 148 lb 6 oz (67.3 kg)      ECOG FS:0 - Asymptomatic  Ocular: Sclerae unicteric, pupils round and equal Lymphatic: No cervical or supraclavicular adenopathy Lungs no rales or rhonchi Heart regular rate and rhythm Abd soft, nontender, positive bowel sounds MSK no focal spinal tenderness, no joint edema Neuro: non-focal, well-oriented, appropriate affect Breasts: The right breast is status post recent biopsy.  The cosmetic result is excellent.  There is no evidence of residual or recurrent disease.  The left breast is benign.  Both axillae are benign   LAB  RESULTS:  CMP     Component Value Date/Time   NA 142 07/26/2018 1459   K 4.6 07/26/2018 1459   CL 110 07/26/2018 1459   CO2 25 07/26/2018 1459   GLUCOSE 96 07/26/2018 1459   BUN 10 07/26/2018 1459   CREATININE 0.93 07/26/2018 1459   CALCIUM 9.1 07/26/2018 1459   PROT 6.5 07/26/2018 1459   ALBUMIN 3.8 07/26/2018 1459   AST 18 07/26/2018 1459   ALT 23 07/26/2018 1459   ALKPHOS 45 07/26/2018 1459   BILITOT 0.5 07/26/2018 1459   GFRNONAA >60 07/26/2018 1459   GFRAA >60 07/26/2018 1459    No results found for: TOTALPROTELP, ALBUMINELP, A1GS, A2GS, BETS, BETA2SER, GAMS, MSPIKE, SPEI  No results found for: KPAFRELGTCHN, LAMBDASER, KAPLAMBRATIO  Lab Results  Component Value Date   WBC 5.7 07/26/2018   NEUTROABS 3.0 07/26/2018   HGB 11.8 (L) 07/26/2018   HCT 37.3 07/26/2018   MCV 83.8 07/26/2018   PLT 258 07/26/2018    '@LASTCHEMISTRY' @  No results found for: LABCA2  No components found for: BLTJQZ009  No results for input(s): INR in the last 168 hours.  No results found for: LABCA2  No results found for: QZR007  No results found for: MAU633  No results found for: HLK562  No results found for: CA2729  No components found for: HGQUANT  No results found for: CEA1 / No results found for: CEA1   No results found for: AFPTUMOR  No results found for: CHROMOGRNA  No results found for: PSA1  Appointment on 07/26/2018  Component Date Value Ref Range Status  . Sodium 07/26/2018 142  135 - 145 mmol/L Final  . Potassium 07/26/2018 4.6  3.5 - 5.1 mmol/L Final  . Chloride 07/26/2018 110  98 - 111 mmol/L Final  . CO2 07/26/2018 25  22 - 32 mmol/L Final  . Glucose, Bld 07/26/2018 96  70 - 99 mg/dL Final  . BUN 07/26/2018 10  8 - 23 mg/dL Final  . Creatinine 07/26/2018 0.93  0.44 - 1.00 mg/dL Final  . Calcium 07/26/2018 9.1  8.9 - 10.3 mg/dL Final  . Total Protein 07/26/2018 6.5  6.5 - 8.1 g/dL Final  . Albumin 07/26/2018 3.8  3.5 - 5.0 g/dL Final  . AST 07/26/2018 18   15 - 41 U/L Final  . ALT 07/26/2018 23  0 - 44 U/L Final  . Alkaline Phosphatase 07/26/2018 45  38 - 126 U/L Final  . Total Bilirubin 07/26/2018 0.5  0.3 - 1.2 mg/dL Final  . GFR, Est Non Af Am 07/26/2018 >60  >60 mL/min Final  . GFR, Est AFR Am 07/26/2018 >60  >60 mL/min Final  . Anion  gap 07/26/2018 7  5 - 15 Final   Performed at [facility], [place] [address] [place], [place] [address]  . WBC Count 07/26/2018 5.7  4.0 - 10.5 K/uL Final  . RBC 07/26/2018 4.45  3.87 - 5.11 MIL/uL Final  . Hemoglobin 07/26/2018 11.8* 12.0 - 15.0 g/dL Final  . HCT 07/26/2018 37.3  36.0 - 46.0 % Final  . MCV 07/26/2018 83.8  80.0 - 100.0 fL Final  . MCH 07/26/2018 26.5  26.0 - 34.0 pg Final  . MCHC 07/26/2018 31.6  30.0 - 36.0 g/dL Final  . RDW 07/26/2018 13.7  11.5 - 15.5 % Final  . Platelet Count 07/26/2018 258  150 - 400 K/uL Final  . nRBC 07/26/2018 0.0  0.0 - 0.2 % Final  . Neutrophils Relative % 07/26/2018 53  % Final  . Neutro Abs 07/26/2018 3.0  1.7 - 7.7 K/uL Final  . Lymphocytes Relative 07/26/2018 35  % Final  . Lymphs Abs 07/26/2018 2.0  0.7 - 4.0 K/uL Final  . Monocytes Relative 07/26/2018 7  % Final  . Monocytes Absolute 07/26/2018 0.4  0.1 - 1.0 K/uL Final  . Eosinophils Relative 07/26/2018 4  % Final  . Eosinophils Absolute 07/26/2018 0.2  0.0 - 0.5 K/uL Final  . Basophils Relative 07/26/2018 1  % Final  . Basophils Absolute 07/26/2018 0.0  0.0 - 0.1 K/uL Final  . Immature Granulocytes 07/26/2018 0  % Final  . Abs Immature Granulocytes 07/26/2018 0.01  0.00 - 0.07 K/uL Final   Performed at [facility], [place] [address], [place], [place] [address]    (this displays the last labs from the last 3 days)  No results found for: TOTALPROTELP, ALBUMINELP, A1GS, A2GS, BETS, BETA2SER, GAMS, MSPIKE, SPEI (this displays SPEP labs)  No results found for: KPAFRELGTCHN, LAMBDASER, KAPLAMBRATIO (kappa/lambda light chains)  No results found for:  HGBA, HGBA2QUANT, HGBFQUANT, HGBSQUAN (Hemoglobinopathy evaluation)   No results found for: LDH  No results found for: IRON, TIBC, IRONPCTSAT (Iron and TIBC)  No results found for: FERRITIN  Urinalysis No results found for: COLORURINE, APPEARANCEUR, LABSPEC, PHURINE, GLUCOSEU, HGBUR, BILIRUBINUR, KETONESUR, PROTEINUR, UROBILINOGEN, NITRITE, LEUKOCYTESUR   STUDIES:  Nm Sentinel Node Inj-no Rpt (breast)  Result Date: 07/12/2018 Sulfur colloid was injected by the nuclear medicine technologist for melanoma sentinel node.   Mm Breast Surgical Specimen  Result Date: 07/12/2018 CLINICAL DATA:  Patient status post right breast lumpectomy. EXAM: SPECIMEN RADIOGRAPH OF THE RIGHT BREAST COMPARISON:  Previous exam(s). FINDINGS: Status post excision of the right breast. The radioactive seed and biopsy marker clip are present, completely intact, and were marked for pathology. IMPRESSION: Specimen radiograph of the right breast. Electronically Signed   By: Lovey Newcomer M.D.   On: 07/12/2018 14:51   Mm Rt Radioactive Seed Loc Mammo Guide  Result Date: 07/09/2018 CLINICAL DATA:  The patient presents for seed localization prior to lumpectomy for recently diagnosed invasive mammary carcinoma in the RIGHT breast 3 o'clock location. EXAM: MAMMOGRAPHIC GUIDED RADIOACTIVE SEED LOCALIZATION OF THE RIGHT BREAST COMPARISON:  Previous exam(s). FINDINGS: Patient presents for radioactive seed localization prior to lumpectomy. I met with the patient and we discussed the procedure of seed localization including benefits and alternatives. We discussed the high likelihood of a successful procedure. We discussed the risks of the procedure including infection, bleeding, tissue injury and further surgery. We discussed the low dose of radioactivity involved in the procedure. Informed, written consent was given. The usual time-out protocol  was performed immediately prior to the procedure. Using mammographic guidance, sterile  technique, 1% lidocaine and an I-125 radioactive seed, the ribbon shaped clip in the MEDIAL aspect of the RIGHT breast was localized using a MEDIAL to LATERAL approach. The follow-up mammogram images confirm the seed in the expected location and were marked for Dr. Ninfa Linden. Follow-up survey of the patient confirms presence of the radioactive seed. Order number of I-125 seed:  [id number]. Total activity:  4.970 millicuries reference Date: 06/14/2017 The patient tolerated the procedure well and was released from the [facility]. She was given instructions regarding seed removal. IMPRESSION: Radioactive seed localization RIGHT breast. No apparent complications. Electronically Signed   By: Nolon Nations M.D.   On: 07/09/2018 15:44     ELIGIBLE FOR AVAILABLE RESEARCH PROTOCOL: no   ASSESSMENT: 72 y.o. [place], [place] woman status post right breast central biopsy 06/21/2018 for clinical T1c N0, stage IA invasive ductal carcinoma, grade 2 or 3, estrogen and progesterone receptor positive, HER-2 not amplified, with an MIB-1 of 5%  (1) status post right lumpectomy 07/12/2018 for a pT1b pN0, stage IA invasive ductal carcinoma, grade 2, with close but negative margins.  There was evidence of perineural invasion.  A total of 2 sentinel lymph nodes were removed  (2) opted against adjuvant radiation  (3) anastrozole started 08/04/2018   PLAN: I spent approximately 60 minutes face to face with Jamaiyah with more than 50% of that time spent in counseling and coordination of care. Specifically we reviewed the biology of the patient's diagnosis and the specifics of her situation. We first reviewed the fact that cancer is not one disease but more than 100 different diseases and that it is important to keep them separate-- otherwise when friends and relatives discuss their own cancer experiences with Tsering confusion can result. Similarly we explained that if breast cancer spreads to the bone or liver, the patient  would not have bone cancer or liver cancer, but breast cancer in the bone and breast cancer in the liver: one cancer in three places-- not 3 different cancers which otherwise would have to be treated in 3 different ways.  We discussed the difference between local and systemic therapy. In terms of loco-regional treatment, lumpectomy plus radiation is equivalent to mastectomy as far as survival is concerned. For this reason, and because the cosmetic results are generally superior, we recommended breast conserving surgery, which she underwent without event.  She has discussed radiation options with Dr. Isidore Moos and understands that in terms of survival, in patients her age with a tumor like hers if radiation is omitted there is no survival disadvantage is Bell long as the patient takes an antiestrogen for 5 years.  Without radiation there will be a slightly high risk of local recurrence.  At this point she would prefer to bypass radiation.  We then discussed the rationale for systemic therapy. There is some risk that this cancer may have already spread to other parts of her body. Patients frequently ask at this point about bone scans, CAT scans and PET scans to find out if they have occult breast cancer somewhere else. The problem is that in early stage disease we are much more likely to find false positives then true cancers and this would expose the patient to unnecessary procedures as well as unnecessary radiation. Scans cannot answer the question the patient really would like to know, which is whether she has microscopic disease elsewhere in her body. For those reasons we do not  recommend them.  Of course we would proceed to aggressive evaluation of any symptoms that might suggest metastatic disease, but that is not the case here.  Next we went over the options for systemic therapy which are anti-estrogens, anti-HER-2 immunotherapy, and chemotherapy. Tsion does not meet criteria for anti-HER-2 immunotherapy.  She is a good candidate for anti-estrogens.  The question of chemotherapy is more complicated. Chemotherapy is most effective in rapidly growing, aggressive tumors. It is much less effective in not high grade, slow growing cancers, like Kristalynn 's. For that reason and also keeping the patient's age in mind I am not uncomfortable with setting chemotherapy aside in this case, even without sending an Oncotype.    We then discussed antiestrogens.  She is interested in anastrozole, and she understands the possible toxicities, side effects and complications of this agent.  She does have a history of osteopenia and is already on alendronate.  She has a bone density scheduled for January.  If there has been a significant change we could always consider intensifying bisphosphonate therapy or switching to denosumab/Prolia.  She will start anastrozole 08/04/2018.  She will return to see me in February and assuming she is tolerating it well the plan will be for 5 years on this medication.  If she does not tolerate it well she can always reconsider the possibility of radiation or give tamoxifen a try  Jatavia has a good understanding of the overall plan. She agrees with it. She knows the goal of treatment in her case is cure. She will call with any problems that may develop before her next visit here.   Daiki Dicostanzo, Virgie Dad, MD  07/26/18 5:02 PM Medical Oncology and Hematology [facility] [address] [place], [place] [address] Tel. [phone]    Fax. [phone]    I, Jacqualyn Posey am acting as a Education administrator for Chauncey Cruel, MD.   I, Lurline Del MD, have reviewed the above documentation for accuracy and completeness, and I agree with the above.

## 2018-07-26 ENCOUNTER — Inpatient Hospital Stay: Payer: Medicare Other | Attending: Oncology | Admitting: Oncology

## 2018-07-26 ENCOUNTER — Encounter: Payer: Self-pay | Admitting: Oncology

## 2018-07-26 ENCOUNTER — Inpatient Hospital Stay: Payer: Medicare Other

## 2018-07-26 VITALS — BP 139/74 | HR 58 | Temp 98.8°F | Resp 17 | Ht 65.0 in | Wt 149.3 lb

## 2018-07-26 DIAGNOSIS — Z7982 Long term (current) use of aspirin: Secondary | ICD-10-CM | POA: Diagnosis not present

## 2018-07-26 DIAGNOSIS — C50111 Malignant neoplasm of central portion of right female breast: Secondary | ICD-10-CM | POA: Diagnosis not present

## 2018-07-26 DIAGNOSIS — Z170 Estrogen receptor positive status [ER+]: Secondary | ICD-10-CM | POA: Insufficient documentation

## 2018-07-26 DIAGNOSIS — Z79899 Other long term (current) drug therapy: Secondary | ICD-10-CM | POA: Diagnosis not present

## 2018-07-26 DIAGNOSIS — Z79811 Long term (current) use of aromatase inhibitors: Secondary | ICD-10-CM | POA: Diagnosis not present

## 2018-07-26 DIAGNOSIS — C50211 Malignant neoplasm of upper-inner quadrant of right female breast: Secondary | ICD-10-CM | POA: Diagnosis not present

## 2018-07-26 LAB — CBC WITH DIFFERENTIAL (CANCER CENTER ONLY)
Abs Immature Granulocytes: 0.01 10*3/uL (ref 0.00–0.07)
Basophils Absolute: 0 10*3/uL (ref 0.0–0.1)
Basophils Relative: 1 %
Eosinophils Absolute: 0.2 10*3/uL (ref 0.0–0.5)
Eosinophils Relative: 4 %
HCT: 37.3 % (ref 36.0–46.0)
Hemoglobin: 11.8 g/dL — ABNORMAL LOW (ref 12.0–15.0)
Immature Granulocytes: 0 %
Lymphocytes Relative: 35 %
Lymphs Abs: 2 10*3/uL (ref 0.7–4.0)
MCH: 26.5 pg (ref 26.0–34.0)
MCHC: 31.6 g/dL (ref 30.0–36.0)
MCV: 83.8 fL (ref 80.0–100.0)
Monocytes Absolute: 0.4 10*3/uL (ref 0.1–1.0)
Monocytes Relative: 7 %
Neutro Abs: 3 10*3/uL (ref 1.7–7.7)
Neutrophils Relative %: 53 %
Platelet Count: 258 10*3/uL (ref 150–400)
RBC: 4.45 MIL/uL (ref 3.87–5.11)
RDW: 13.7 % (ref 11.5–15.5)
WBC: 5.7 10*3/uL (ref 4.0–10.5)
nRBC: 0 % (ref 0.0–0.2)

## 2018-07-26 LAB — CMP (CANCER CENTER ONLY)
ALT: 23 U/L (ref 0–44)
AST: 18 U/L (ref 15–41)
Albumin: 3.8 g/dL (ref 3.5–5.0)
Alkaline Phosphatase: 45 U/L (ref 38–126)
Anion gap: 7 (ref 5–15)
BUN: 10 mg/dL (ref 8–23)
CHLORIDE: 110 mmol/L (ref 98–111)
CO2: 25 mmol/L (ref 22–32)
Calcium: 9.1 mg/dL (ref 8.9–10.3)
Creatinine: 0.93 mg/dL (ref 0.44–1.00)
GFR, Est AFR Am: >60 mL/min (ref >60)
Glucose, Bld: 96 mg/dL (ref 70–99)
Potassium: 4.6 mmol/L (ref 3.5–5.1)
Sodium: 142 mmol/L (ref 135–145)
Total Bilirubin: 0.5 mg/dL (ref 0.3–1.2)
Total Protein: 6.5 g/dL (ref 6.5–8.1)

## 2018-07-26 MED ORDER — ANASTROZOLE 1 MG PO TABS: 1.0000 mg | ORAL_TABLET | Freq: Every day | ORAL | 4 refills | Status: DC

## 2018-07-27 ENCOUNTER — Telehealth: Payer: Self-pay | Admitting: *Deleted

## 2018-07-27 NOTE — Telephone Encounter (Signed)
Patient called to cancel follow up appointment with Dr. Isidore Moos on the 07/30/18. The reason she gave was that Dr. Jana Hakim said so.

## 2018-07-29 ENCOUNTER — Encounter: Payer: Self-pay | Admitting: *Deleted

## 2018-07-30 ENCOUNTER — Ambulatory Visit
Admission: RE | Admit: 2018-07-30 | Discharge: 2018-07-30 | Disposition: A | Discharge status: Home / Self Care | Payer: Medicare Other | Source: Ambulatory Visit | Attending: Radiation Oncology | Admitting: Radiation Oncology

## 2018-07-30 DIAGNOSIS — Z170 Estrogen receptor positive status [ER+]: Secondary | ICD-10-CM | POA: Diagnosis not present

## 2018-07-30 DIAGNOSIS — Z483 Aftercare following surgery for neoplasm: Secondary | ICD-10-CM | POA: Diagnosis not present

## 2018-07-30 DIAGNOSIS — Z9011 Acquired absence of right breast and nipple: Secondary | ICD-10-CM | POA: Diagnosis not present

## 2018-07-30 DIAGNOSIS — C50911 Malignant neoplasm of unspecified site of right female breast: Secondary | ICD-10-CM | POA: Diagnosis not present

## 2018-08-09 DIAGNOSIS — Z170 Estrogen receptor positive status [ER+]: Secondary | ICD-10-CM | POA: Diagnosis not present

## 2018-08-09 DIAGNOSIS — Z483 Aftercare following surgery for neoplasm: Secondary | ICD-10-CM | POA: Diagnosis not present

## 2018-08-09 DIAGNOSIS — C50911 Malignant neoplasm of unspecified site of right female breast: Secondary | ICD-10-CM | POA: Diagnosis not present

## 2018-08-09 DIAGNOSIS — Z9011 Acquired absence of right breast and nipple: Secondary | ICD-10-CM | POA: Diagnosis not present

## 2018-08-10 ENCOUNTER — Ambulatory Visit
Admission: RE | Admit: 2018-08-10 | Discharge: 2018-08-10 | Disposition: A | Discharge status: Home / Self Care | Payer: Medicare Other | Source: Ambulatory Visit | Attending: Physician Assistant | Admitting: Physician Assistant

## 2018-08-10 ENCOUNTER — Ambulatory Visit: Payer: Medicare Other

## 2018-08-10 DIAGNOSIS — M8588 Other specified disorders of bone density and structure, other site: Secondary | ICD-10-CM | POA: Diagnosis not present

## 2018-08-10 DIAGNOSIS — E2839 Other primary ovarian failure: Secondary | ICD-10-CM

## 2018-08-10 DIAGNOSIS — Z780 Asymptomatic menopausal state: Secondary | ICD-10-CM | POA: Diagnosis not present

## 2018-08-11 DIAGNOSIS — H40003 Preglaucoma, unspecified, bilateral: Secondary | ICD-10-CM | POA: Diagnosis not present

## 2018-08-11 DIAGNOSIS — H2513 Age-related nuclear cataract, bilateral: Secondary | ICD-10-CM | POA: Diagnosis not present

## 2018-09-22 NOTE — Progress Notes (Signed)
[facility]  [phone] [phone]   ID: Emily Bell DOB: 1946-05-06  MR#: [id number]  KCL#:[id number]  Patient Care Team: Cyndi Bender, Hershal Coria as PCP - General (Physician Assistant) Arnett Galindez, Virgie Dad, MD as Consulting Physician (Oncology) Coralie Keens, MD as Consulting Physician (General Surgery) Eppie Gibson, MD as Attending Physician (Radiation Oncology) Richmond Campbell, MD as Consulting Physician (Gastroenterology) OTHER MD:   CHIEF COMPLAINT: Estrogen receptor positive breast cancer   CURRENT TREATMENT: Anastrozole   HISTORY OF CURRENT ILLNESS: From the original intake note:  Emily Bell presented with focal pain in the retroareolar right breast as well as in the inferior left breast near the inframammary fold. She originally had a routine screening in 08/2018, but called and had her mammography pushed up. She underwent bilateral diagnostic mammography with tomography and bilateral breast ultrasonography at [facility] on 06/17/2018 showing: Breast Density Category B. In the lower slightly inner quadrant of the right breast there is an irregular mass which on the CC view appears to be associated with some distortion. The mass measures approximately 1 cm.  On physical exam, no discrete palpable masses are identified in the medial right breast. Ultrasound of the right breast at 3 o'clock, 1 cm from the nipple demonstrates an irregular hypoechoic mass measuring 1.1 x 0.5 x 0.7 cm. Ultrasound of the right axilla demonstrates multiple normal-appearing lymph nodes.   There are no suspicious findings seen at the site of focal pain in the anterior left breast. Ultrasound of the left breast at the site of pain demonstrates normal fibroglandular tissue. No masses or suspicious areas of shadowing are identified.   Accordingly on 06/21/2018 she proceeded to biopsy of the right breast area in question. The pathology from this procedure showed  ([id number]): invasive mammary carcinoma, Grade II - III, and a mammary carcinoma in situ, intermediate nuclear grade. E-cadherin is positive, consistent with a ductal phenotype. Prognostic indicators significant for: estrogen receptor, 90% positive and progesterone receptor, 90% positive, both with strong staining intensity. Proliferation marker Ki67 at 5%. HER2 negative by immunohistochemistry (1+).   On 07/12/2018 she underwent a right breast partial mastectomy. Pathology from this procedure ([id number]) shows an invasive ductal carcinoma, grade II/III, spanning 1.1 cm, and a ductal carcinoma in situ, low grade. Perineural invasion is identified. Ductal carcinoma in situ is focally 0.1 cm to the anterior margin. Invasive ductal carcinoma is focally less than 0.1 cm to the medial margin. Both sentinel right axillary lymph nodes biopsied were clear.  The patient's subsequent history is as detailed below.   INTERVAL HISTORY: Emily Bell returns today for follow-up and treatment of her estrogen receptor positive breast cancer. She is accompanied by her husband.  She continues on anastrozole. She has some hot flashes, but they are not bad and she has no accompanying diaphoresis. They do not bother her at night.   Emily Bell's last bone density screening on 08/10/2018, showed a T-score of -1.9, which is considered osteopenic.    Since her last visit here, she has not undergone any additional studies.    REVIEW OF SYSTEMS: Emily Bell has felt some spasms and shooting pain in the breast following her surgery. For exercise, she usually walks on her treadmill 3 times a week for around 30 minutes each time. She also has an exercise bike that utilizes an elliptical-like feature for her arms, but she wanted to verify that using it would not interfere with her recovery. The patient denies unusual headaches, visual changes, nausea, vomiting, or dizziness.  There has been no unusual cough, phlegm production, or pleurisy.  This been no change in bowel or bladder habits. The patient denies unexplained fatigue or unexplained weight loss, bleeding, rash, or fever. A detailed review of systems was otherwise noncontributory.    PAST MEDICAL HISTORY: Past Medical History:  Diagnosis Date  . Anemia   . Cancer [facility])    right breast  . Colon polyps   . Hypercholesteremia   . Osteoporosis   . Vitamin B 12 deficiency   . Vitamin D deficiency      PAST SURGICAL HISTORY: Past Surgical History:  Procedure Laterality Date  . BREAST LUMPECTOMY WITH RADIOACTIVE SEED AND SENTINEL LYMPH NODE BIOPSY Right 07/12/2018   Procedure: RIGHT BREAST PARTIAL MASTECTOMY WITH RADIOACTIVE SEED AND SENTINEL LYMPH NODE BIOPSY;  Surgeon: Coralie Keens, MD;  Location: [facility];  Service: General;  Laterality: Right;  . COLONOSCOPY    . neg hx      FAMILY HISTORY: Family History  Problem Relation Age of Onset  . Hypertension Bell   . Stomach cancer Bell   . Cancer Cousin        Breast  . Colon cancer Neg Hx    Emily Bell died from pneumonia at age 27. 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 The patient has 2 brothers. The patient has one first cousin with breast cancer. Patient denies anyone in her family having ovarian, prostate or pancreatic cancer.    GYNECOLOGIC HISTORY:  No LMP recorded. Patient is postmenopausal. 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 Before she was a Quarry manager, she worked at [employer]. Her husband, Emily Bell, is retired from Therapist, art.  The patient has 3 children: Emily Bell, and Emily Bell. Emily Bell is 81, lives in [place], and is retired from the [employer]. Emily Bell is 44, lives in [place], [place], and is a Media planner. Emily Bell is 2, lives in  [place], [place], and drives long distances. Emily Bell has 5 grandchildren, 4 of them in their 47s, 1 (in [place]) 73 years old.  No great grandchildren. She attends [employer].   ADVANCED DIRECTIVES:    HEALTH MAINTENANCE: Social History   Tobacco Use  . Smoking status: Never Smoker  . Smokeless tobacco: Never Used  Substance Use Topics  . Alcohol use: No  . Drug use: No    Colonoscopy: March 2012, [facility]  PAP: 4+ years ago at [facility] density: next scheduled for 08/12/2018  No Known Allergies  Current Outpatient Medications  Medication Sig Dispense Refill  . alendronate (FOSAMAX) 70 MG tablet Take 70 mg by mouth every Monday. Take with a full glass of water on an empty stomach.     Marland Kitchen anastrozole (ARIMIDEX) 1 MG tablet Take 1 tablet (1 mg total) by mouth daily. 90 tablet 4  . aspirin 81 MG tablet Take 81 mg by mouth daily.    . Calcium Carb-Cholecalciferol (CALCIUM 600 + D PO) Take 1 tablet by mouth daily.    . Cholecalciferol (VITAMIN D3) 2000 UNITS TABS Take 2,000 Units by mouth daily.     . Coenzyme Q10 (CO Q 10 PO) Take 200 mg by mouth daily.     . magnesium oxide (MAG-OX) 400  MG tablet Take 400 mg by mouth daily.    . traMADol (ULTRAM) 50 MG tablet Take 1 tablet (50 mg total) by mouth every 6 (six) hours as needed for moderate pain. 30 tablet 0  . vitamin B-12 (CYANOCOBALAMIN) 1000 MCG tablet Take 1,000 mcg by mouth daily.     No current facility-administered medications for this visit.      OBJECTIVE: Older African-American woman in no acute distress  Vitals:   09/23/18 1040  BP: (!) 123/58  Pulse: (!) 57  Resp: 18  Temp: 97.9 F (36.6 C)  SpO2: 100%     Body mass index is 24.5 kg/m.   Wt Readings from Last 3 Encounters:  09/23/18 147 lb 3.2 oz (66.8 kg)  07/26/18 149 lb 4.8 oz (67.7 kg)  07/09/18 148 lb 11.2 oz (67.4 kg)      ECOG FS:0 - Asymptomatic  Sclerae unicteric, EOMs intact Oropharynx clear and moist No cervical or  supraclavicular adenopathy Lungs no rales or rhonchi Heart regular rate and rhythm Abd soft, nontender, positive bowel sounds MSK no focal spinal tenderness, no upper extremity lymphedema Neuro: nonfocal, well oriented, appropriate affect Breasts: The right breast is status post lumpectomy.  There is a mild distortion of the breast contour.  There is no evidence of disease recurrence.  The left breast is benign.  Both axillae are benign.  LAB RESULTS:  CMP     Component Value Date/Time   NA 142 07/26/2018 1459   K 4.6 07/26/2018 1459   CL 110 07/26/2018 1459   CO2 25 07/26/2018 1459   GLUCOSE 96 07/26/2018 1459   BUN 10 07/26/2018 1459   CREATININE 0.93 07/26/2018 1459   CALCIUM 9.1 07/26/2018 1459   PROT 6.5 07/26/2018 1459   ALBUMIN 3.8 07/26/2018 1459   AST 18 07/26/2018 1459   ALT 23 07/26/2018 1459   ALKPHOS 45 07/26/2018 1459   BILITOT 0.5 07/26/2018 1459   GFRNONAA >60 07/26/2018 1459   GFRAA >60 07/26/2018 1459    No results found for: TOTALPROTELP, ALBUMINELP, A1GS, A2GS, BETS, BETA2SER, GAMS, MSPIKE, SPEI  No results found for: KPAFRELGTCHN, LAMBDASER, KAPLAMBRATIO  Lab Results  Component Value Date   WBC 5.7 07/26/2018   NEUTROABS 3.0 07/26/2018   HGB 11.8 (L) 07/26/2018   HCT 37.3 07/26/2018   MCV 83.8 07/26/2018   PLT 258 07/26/2018    '@LASTCHEMISTRY' @  No results found for: LABCA2  No components found for: TFTDDU202  No results for input(s): INR in the last 168 hours.  No results found for: LABCA2  No results found for: RKY706  No results found for: CBJ628  No results found for: BTD176  No results found for: CA2729  No components found for: HGQUANT  No results found for: CEA1 / No results found for: CEA1   No results found for: AFPTUMOR  No results found for: CHROMOGRNA  No results found for: PSA1  No visits with results within 3 Day(s) from this visit.  Latest known visit with results is:  Appointment on 07/26/2018  Component  Date Value Ref Range Status  . Sodium 07/26/2018 142  135 - 145 mmol/L Final  . Potassium 07/26/2018 4.6  3.5 - 5.1 mmol/L Final  . Chloride 07/26/2018 110  98 - 111 mmol/L Final  . CO2 07/26/2018 25  22 - 32 mmol/L Final  . Glucose, Bld 07/26/2018 96  70 - 99 mg/dL Final  . BUN 07/26/2018 10  8 - 23 mg/dL Final  . Creatinine 07/26/2018  0.93  0.44 - 1.00 mg/dL Final  . Calcium 07/26/2018 9.1  8.9 - 10.3 mg/dL Final  . Total Protein 07/26/2018 6.5  6.5 - 8.1 g/dL Final  . Albumin 07/26/2018 3.8  3.5 - 5.0 g/dL Final  . AST 07/26/2018 18  15 - 41 U/L Final  . ALT 07/26/2018 23  0 - 44 U/L Final  . Alkaline Phosphatase 07/26/2018 45  38 - 126 U/L Final  . Total Bilirubin 07/26/2018 0.5  0.3 - 1.2 mg/dL Final  . GFR, Est Non Af Am 07/26/2018 >60  >60 mL/min Final  . GFR, Est AFR Am 07/26/2018 >60  >60 mL/min Final  . Anion gap 07/26/2018 7  5 - 15 Final   Performed at [facility], [place] [address], [place], [place] [address]  . WBC Count 07/26/2018 5.7  4.0 - 10.5 K/uL Final  . RBC 07/26/2018 4.45  3.87 - 5.11 MIL/uL Final  . Hemoglobin 07/26/2018 11.8* 12.0 - 15.0 g/dL Final  . HCT 07/26/2018 37.3  36.0 - 46.0 % Final  . MCV 07/26/2018 83.8  80.0 - 100.0 fL Final  . MCH 07/26/2018 26.5  26.0 - 34.0 pg Final  . MCHC 07/26/2018 31.6  30.0 - 36.0 g/dL Final  . RDW 07/26/2018 13.7  11.5 - 15.5 % Final  . Platelet Count 07/26/2018 258  150 - 400 K/uL Final  . nRBC 07/26/2018 0.0  0.0 - 0.2 % Final  . Neutrophils Relative % 07/26/2018 53  % Final  . Neutro Abs 07/26/2018 3.0  1.7 - 7.7 K/uL Final  . Lymphocytes Relative 07/26/2018 35  % Final  . Lymphs Abs 07/26/2018 2.0  0.7 - 4.0 K/uL Final  . Monocytes Relative 07/26/2018 7  % Final  . Monocytes Absolute 07/26/2018 0.4  0.1 - 1.0 K/uL Final  . Eosinophils Relative 07/26/2018 4  % Final  . Eosinophils Absolute 07/26/2018 0.2  0.0 - 0.5 K/uL Final  . Basophils Relative 07/26/2018 1  % Final  . Basophils Absolute  07/26/2018 0.0  0.0 - 0.1 K/uL Final  . Immature Granulocytes 07/26/2018 0  % Final  . Abs Immature Granulocytes 07/26/2018 0.01  0.00 - 0.07 K/uL Final   Performed at [facility], [place] [address] [place],  [address]    (this displays the last labs from the last 3 days)  No results found for: TOTALPROTELP, ALBUMINELP, A1GS, A2GS, BETS, BETA2SER, GAMS, MSPIKE, SPEI (this displays SPEP labs)  No results found for: KPAFRELGTCHN, LAMBDASER, KAPLAMBRATIO (kappa/lambda light chains)  No results found for: HGBA, HGBA2QUANT, HGBFQUANT, HGBSQUAN (Hemoglobinopathy evaluation)   No results found for: LDH  No results found for: IRON, TIBC, IRONPCTSAT (Iron and TIBC)  No results found for: FERRITIN  Urinalysis No results found for: COLORURINE, APPEARANCEUR, LABSPEC, PHURINE, GLUCOSEU, HGBUR, BILIRUBINUR, KETONESUR, PROTEINUR, UROBILINOGEN, NITRITE, LEUKOCYTESUR   STUDIES:  Bone density results discussed with the patient  ELIGIBLE FOR AVAILABLE RESEARCH PROTOCOL: no   ASSESSMENT: 73 y.o. [place], [place] woman status post right breast central biopsy 06/21/2018 for clinical T1c N0, stage IA invasive ductal carcinoma, grade 2 or 3, estrogen and progesterone receptor positive, HER-2 not amplified, with an MIB-1 of 5%  (1) status post right lumpectomy 07/12/2018 for a pT1b pN0, stage IA invasive ductal carcinoma, grade 2, with close but negative margins.  There was evidence of perineural invasion.  A total of 2 sentinel lymph nodes were removed  (2) opted against adjuvant radiation  (3) anastrozole started  08/04/2018  (a) bone density 08/10/2018 showed a T score of -1.9   PLAN: Terie is tolerating anastrozole remarkably well and the plan will be to continue that a total of 5 years.  Her bone density is adequate and she is tolerating alendronate without difficulty.  She will continue that as before.  If she sees her surgeon Dr. Ninfa Linden in May and sees her  primary doctor in August then I will see her again in December after her next mammogram  She knows to call for any other issues that may develop before the next visit.   Zaidan Keeble, Virgie Dad, MD  09/23/18 11:00 AM Medical Oncology and Hematology [facility] [address] [place], [place] [address] Tel. [phone]    Fax. [phone]   I, Jacqualyn Posey am acting as a Education administrator for Chauncey Cruel, MD.   I, Lurline Del MD, have reviewed the above documentation for accuracy and completeness, and I agree with the above.

## 2018-09-23 ENCOUNTER — Telehealth: Payer: Self-pay | Admitting: Oncology

## 2018-09-23 ENCOUNTER — Inpatient Hospital Stay: Payer: Medicare Other | Attending: Oncology | Admitting: Oncology

## 2018-09-23 VITALS — BP 123/58 | HR 57 | Temp 97.9°F | Resp 18 | Ht 65.0 in | Wt 147.2 lb

## 2018-09-23 DIAGNOSIS — Z79811 Long term (current) use of aromatase inhibitors: Secondary | ICD-10-CM | POA: Insufficient documentation

## 2018-09-23 DIAGNOSIS — C50811 Malignant neoplasm of overlapping sites of right female breast: Secondary | ICD-10-CM | POA: Diagnosis not present

## 2018-09-23 DIAGNOSIS — Z170 Estrogen receptor positive status [ER+]: Secondary | ICD-10-CM | POA: Insufficient documentation

## 2018-09-23 DIAGNOSIS — C50111 Malignant neoplasm of central portion of right female breast: Secondary | ICD-10-CM

## 2018-09-23 NOTE — Telephone Encounter (Signed)
Gave avs and calendar ° °

## 2018-09-27 ENCOUNTER — Encounter: Payer: Self-pay | Admitting: *Deleted

## 2018-09-28 ENCOUNTER — Telehealth: Payer: Self-pay | Admitting: Adult Health

## 2018-09-28 NOTE — Telephone Encounter (Signed)
Scheduled aptp per 2/24 sch message - sent reminder letter in the mail with apt date and time

## 2018-09-30 ENCOUNTER — Telehealth: Payer: Self-pay

## 2018-09-30 NOTE — Telephone Encounter (Signed)
Left message for patient to return call regarding message left about Medicare.

## 2018-10-01 ENCOUNTER — Other Ambulatory Visit: Payer: Self-pay | Admitting: *Deleted

## 2018-10-01 MED ORDER — ANASTROZOLE 1 MG PO TABS: 1.0000 mg | ORAL_TABLET | Freq: Every day | ORAL | 4 refills | Status: DC

## 2018-11-19 DIAGNOSIS — R197 Diarrhea, unspecified: Secondary | ICD-10-CM | POA: Diagnosis not present

## 2018-12-31 DIAGNOSIS — C50911 Malignant neoplasm of unspecified site of right female breast: Secondary | ICD-10-CM | POA: Diagnosis not present

## 2019-01-05 ENCOUNTER — Other Ambulatory Visit: Payer: Self-pay | Admitting: Oncology

## 2019-01-07 ENCOUNTER — Telehealth: Payer: Self-pay | Admitting: Adult Health

## 2019-01-07 NOTE — Telephone Encounter (Signed)
I talk with patient regarding virtual visit and she has iphone

## 2019-01-10 DIAGNOSIS — Z79899 Other long term (current) drug therapy: Secondary | ICD-10-CM | POA: Diagnosis not present

## 2019-01-10 DIAGNOSIS — E7800 Pure hypercholesterolemia, unspecified: Secondary | ICD-10-CM | POA: Diagnosis not present

## 2019-01-10 DIAGNOSIS — R197 Diarrhea, unspecified: Secondary | ICD-10-CM | POA: Diagnosis not present

## 2019-01-10 DIAGNOSIS — C50919 Malignant neoplasm of unspecified site of unspecified female breast: Secondary | ICD-10-CM | POA: Diagnosis not present

## 2019-01-19 ENCOUNTER — Telehealth: Payer: Self-pay | Admitting: Adult Health

## 2019-01-19 NOTE — Telephone Encounter (Signed)
Called patient regarding upcoming webex appointment, per patient's request this will be a telephone visit.

## 2019-01-19 NOTE — Telephone Encounter (Signed)
Opened by accident

## 2019-01-21 ENCOUNTER — Telehealth: Payer: Self-pay | Admitting: Adult Health

## 2019-01-21 NOTE — Telephone Encounter (Signed)
Called pt remind about ph call

## 2019-01-24 ENCOUNTER — Encounter: Payer: Self-pay | Admitting: Adult Health

## 2019-01-24 ENCOUNTER — Inpatient Hospital Stay: Payer: Medicare Other | Attending: Adult Health | Admitting: Adult Health

## 2019-01-24 DIAGNOSIS — Z923 Personal history of irradiation: Secondary | ICD-10-CM | POA: Diagnosis not present

## 2019-01-24 DIAGNOSIS — Z170 Estrogen receptor positive status [ER+]: Secondary | ICD-10-CM

## 2019-01-24 DIAGNOSIS — C50111 Malignant neoplasm of central portion of right female breast: Secondary | ICD-10-CM

## 2019-01-24 DIAGNOSIS — Z79811 Long term (current) use of aromatase inhibitors: Secondary | ICD-10-CM

## 2019-01-24 NOTE — Progress Notes (Signed)
SURVIVORSHIP VIRTUAL VISIT:  I connected with Idabell Kravitz on 01/24/19 at  2:00 PM EDT by telephone and verified that I am speaking with the correct person using two identifiers.   I discussed the limitations, risks, security and privacy concerns of performing an evaluation and management service by telephone and the availability of in person appointments. I also discussed with the patient that there may be a patient responsible charge related to this service. The patient expressed understanding and agreed to proceed.   BRIEF ONCOLOGIC HISTORY:  Oncology History  Carcinoma of central portion of right breast in female, estrogen receptor positive ([place])  06/21/2018 Initial Diagnosis    status post right breast central biopsy for clinical T1c N0, stage IA invasive ductal carcinoma, grade 2 or 3, estrogen and progesterone receptor positive, HER-2 not amplified, with an MIB-1 of 5%   07/10/2018 Cancer Staging   Staging form: Breast, AJCC 8th Edition - Clinical: Stage IA (cT1c, cN0, cM0, G3, ER+, PR+, HER2-) - Signed by Eppie Gibson, MD on 07/10/2018   07/12/2018 Surgery   (1) status post right lumpectomy 07/12/2018 for a pT1b pN0, stage IA invasive ductal carcinoma, grade 2, with close but negative margins.  There was evidence of perineural invasion.  A total of 2 sentinel lymph nodes were removed    Radiation Therapy   Patient opted against   08/2018 -  Anti-estrogen oral therapy   Anastrozole daily     INTERVAL HISTORY:  Ms. Dunbar to review her survivorship care plan detailing her treatment course for breast cancer, as well as monitoring long-term side effects of that treatment, education regarding health maintenance, screening, and overall wellness and health promotion.     Overall, Ms. Georgiades reports feeling quite well.  She is taking Anastrozole daily.  She says she has some hot flashes from the Anastrozole that are tolerable and one to two times per day.  She denies vaginal dryness or  arthralgias.  She is also taking Fosamax weekly for her bones.    Zahara is doing some home projects with remodeling.  She isn't actively exercising as far as walking is concerned.  She says she is walking an increased amount throughout the day.    REVIEW OF SYSTEMS:  Review of Systems  Constitutional: Negative for appetite change, chills, fatigue, fever and unexpected weight change.  HENT:   Negative for hearing loss, lump/mass, mouth sores, sore throat and voice change.   Eyes: Negative for eye problems and icterus.  Respiratory: Negative for chest tightness, cough and shortness of breath.   Cardiovascular: Negative for chest pain, leg swelling and palpitations.  Gastrointestinal: Negative for abdominal distention, abdominal pain, constipation, diarrhea, nausea and vomiting.  Endocrine: Positive for hot flashes.  Musculoskeletal: Negative for arthralgias.  Skin: Negative for itching and rash.  Neurological: Negative for dizziness, extremity weakness, headaches, numbness and seizures.  Hematological: Negative for adenopathy. Does not bruise/bleed easily.  Psychiatric/Behavioral: Negative for depression. The patient is not nervous/anxious.   Breast: Denies any new nodularity, masses, tenderness, nipple changes, or nipple discharge.      ONCOLOGY TREATMENT TEAM:  1. Surgeon:  Dr. Ninfa Linden at [facility] 2. Medical Oncologist: Dr. Jana Hakim  3. Radiation Oncologist: Dr. Isidore Moos    PAST MEDICAL/SURGICAL HISTORY:  Past Medical History:  Diagnosis Date  . Anemia   . Cancer [facility])    right breast  . Colon polyps   . Hypercholesteremia   . Osteoporosis   . Vitamin B 12 deficiency   . Vitamin  D deficiency    Past Surgical History:  Procedure Laterality Date  . BREAST LUMPECTOMY WITH RADIOACTIVE SEED AND SENTINEL LYMPH NODE BIOPSY Right 07/12/2018   Procedure: RIGHT BREAST PARTIAL MASTECTOMY WITH RADIOACTIVE SEED AND SENTINEL LYMPH NODE BIOPSY;  Surgeon: Coralie Keens,  MD;  Location: [place];  Service: General;  Laterality: Right;  . COLONOSCOPY    . neg hx       ALLERGIES:  No Known Allergies   CURRENT MEDICATIONS:  Outpatient Encounter Medications as of 01/24/2019  Medication Sig  . alendronate (FOSAMAX) 70 MG tablet TAKE 1 TABLET EVERY MONDAY WITH A FULL GLASS OF WATER ON AN EMPTY STOMACH  . anastrozole (ARIMIDEX) 1 MG tablet Take 1 tablet (1 mg total) by mouth daily.  Marland Kitchen aspirin 81 MG tablet Take 81 mg by mouth daily.  . Calcium Carb-Cholecalciferol (CALCIUM 600 + D PO) Take 1 tablet by mouth daily.  . Cholecalciferol (VITAMIN D3) 2000 UNITS TABS Take 2,000 Units by mouth daily.   . Coenzyme Q10 (CO Q 10 PO) Take 200 mg by mouth daily.   . magnesium oxide (MAG-OX) 400 MG tablet Take 400 mg by mouth daily.  . traMADol (ULTRAM) 50 MG tablet Take 1 tablet (50 mg total) by mouth every 6 (six) hours as needed for moderate pain.  . vitamin B-12 (CYANOCOBALAMIN) 1000 MCG tablet Take 1,000 mcg by mouth daily.   No facility-administered encounter medications on file as of 01/24/2019.      ONCOLOGIC FAMILY HISTORY:  Family History  Problem Relation Age of Onset  . Hypertension Mother   . Stomach cancer Mother   . Cancer Cousin        Breast  . Colon cancer Neg Hx      GENETIC COUNSELING/TESTING: Not at this time  SOCIAL HISTORY:  Social History   Socioeconomic History  . Marital status: Married    Spouse name: Not on file  . Number of children: 3  . Years of education: Not on file  . Highest education level: Not on file  Occupational History  . Occupation: retired  Scientific laboratory technician  . Financial resource strain: Not on file  . Food insecurity    Worry: Not on file    Inability: Not on file  . Transportation needs    Medical: No    Non-medical: No  Tobacco Use  . Smoking status: Never Smoker  . Smokeless tobacco: Never Used  Substance and Sexual Activity  . Alcohol use: No  . Drug use: No  . Sexual activity: Not on file  Lifestyle   . Physical activity    Days per week: Not on file    Minutes per session: Not on file  . Stress: Not on file  Relationships  . Social Herbalist on phone: Not on file    Gets together: Not on file    Attends religious service: Not on file    Active member of club or organization: Not on file    Attends meetings of clubs or organizations: Not on file    Relationship status: Not on file  . Intimate partner violence    Fear of current or ex partner: No    Emotionally abused: No    Physically abused: No    Forced sexual activity: No  Other Topics Concern  . Not on file  Social History Narrative  . Not on file     OBSERVATIONS/OBJECTIVE:  Patient sounds well.  Speech is not pressured.  Breathing is not labored.  No apparent distress.  Mood and behavior are normal.  LABORATORY DATA:  None for this visit.  DIAGNOSTIC IMAGING:  None for this visit.      ASSESSMENT AND PLAN:  Ms.. Aker is a pleasant 73 y.o. female with Stage IA right breast invasive ductal carcinoma, ER+/PR+/HER2-, diagnosed in 06/2018, treated with lumpectomy, and anti-estrogen therapy with Anastrozole beginning in 08/2018.  She presents to the Survivorship [facility] for our initial meeting and routine follow-up post-completion of treatment for breast cancer.    1. Stage IA right breast cancer:  Ms. Storr is continuing to recover from definitive treatment for breast cancer. She will follow-up with her medical oncologist, Dr. Jana Hakim in 07/2019 with history and physical exam per surveillance protocol.  She will continue her anti-estrogen therapy with Anastrozole. Thus far, she is tolerating the Anastrozole well, with minimal side effects. Her mammogram is due 06/2019; orders placed today.  Today, a comprehensive survivorship care plan and treatment summary was reviewed with the patient today detailing her breast cancer diagnosis, treatment course, potential late/long-term effects of treatment, appropriate  follow-up care with recommendations for the future, and patient education resources.  A copy of this summary, along with a letter will be sent to the patient's primary care provider via mail/fax/In Basket message after today's visit.    2. Bone health:  Given Ms. Selk's age/history of breast cancer and her current treatment regimen including anti-estrogen therapy with Anastrozole, she is at risk for bone demineralization.  Her last DEXA scan was in 08/2018, which showed osteopenia with a T score of -1.9 in the L1-L4 spine.  She was recommended to repeat bone density testing in 08/2020.  In the meantime, she was encouraged to increase her consumption of foods rich in calcium, as well as increase her weight-bearing activities.  She was given education on specific activities to promote bone health.  3. Cancer screening:  Due to Ms. Hanning's history and her age, she should receive screening for skin cancers, colon cancer, and gynecologic cancers.  The information and recommendations are listed on the patient's comprehensive care plan/treatment summary and were reviewed in detail with the patient.    4. Health maintenance and wellness promotion: Ms. Vipond was encouraged to consume 5-7 servings of fruits and vegetables per day. We reviewed the "Nutrition Rainbow" handout, as well as the handout "Take Control of Your Health and Reduce Your Cancer Risk" from the [place].  She was also encouraged to engage in moderate to vigorous exercise for 30 minutes per day most days of the week. We discussed the [employer] fitness program, which is designed for cancer survivors to help them become more physically fit after cancer treatments.  She was instructed to limit her alcohol consumption and continue to abstain from tobacco use.     5. Support services/counseling: It is not uncommon for this period of the patient's cancer care trajectory to be one of many emotions and stressors.  We discussed how this  can be increasingly difficult during the times of quarantine and social distancing due to the COVID-19 pandemic.   She was given information regarding our available services and encouraged to contact me with any questions or for help enrolling in any of our support group/programs.    Follow up instructions:    -Return to cancer center 07/2019 for f/u with Dr. Jana Hakim -Mammogram due in 06/2019 -Bone Density testing 08/2020 -Follow up with Dr. Ninfa Linden 12/2019  -She is welcome to return  back to the Survivorship Clinic at any time; no additional follow-up needed at this time.  -Consider referral back to survivorship as a long-term survivor for continued surveillance  The patient was provided an opportunity to ask questions and all were answered. The patient agreed with the plan and demonstrated an understanding of the instructions.   The patient was advised to call back or seek an in-person evaluation if the symptoms worsen or if the condition fails to improve as anticipated.   I provided 12 minutes of non face-to-face telephone visit time during this encounter, and > 50% was spent counseling as documented under my assessment & plan.  Scot Dock, NP

## 2019-01-27 DIAGNOSIS — E559 Vitamin D deficiency, unspecified: Secondary | ICD-10-CM | POA: Diagnosis not present

## 2019-01-27 DIAGNOSIS — E7800 Pure hypercholesterolemia, unspecified: Secondary | ICD-10-CM | POA: Diagnosis not present

## 2019-01-27 DIAGNOSIS — D649 Anemia, unspecified: Secondary | ICD-10-CM | POA: Diagnosis not present

## 2019-03-19 ENCOUNTER — Other Ambulatory Visit: Payer: Self-pay | Admitting: Oncology

## 2019-04-04 DIAGNOSIS — E559 Vitamin D deficiency, unspecified: Secondary | ICD-10-CM | POA: Diagnosis not present

## 2019-04-04 DIAGNOSIS — E7800 Pure hypercholesterolemia, unspecified: Secondary | ICD-10-CM | POA: Diagnosis not present

## 2019-04-04 DIAGNOSIS — R5383 Other fatigue: Secondary | ICD-10-CM | POA: Diagnosis not present

## 2019-04-04 DIAGNOSIS — D649 Anemia, unspecified: Secondary | ICD-10-CM | POA: Diagnosis not present

## 2019-04-04 DIAGNOSIS — M810 Age-related osteoporosis without current pathological fracture: Secondary | ICD-10-CM | POA: Diagnosis not present

## 2019-05-16 DIAGNOSIS — Z0000 Encounter for general adult medical examination without abnormal findings: Secondary | ICD-10-CM | POA: Diagnosis not present

## 2019-05-16 DIAGNOSIS — Z9181 History of falling: Secondary | ICD-10-CM | POA: Diagnosis not present

## 2019-05-16 DIAGNOSIS — E785 Hyperlipidemia, unspecified: Secondary | ICD-10-CM | POA: Diagnosis not present

## 2019-05-16 DIAGNOSIS — Z1331 Encounter for screening for depression: Secondary | ICD-10-CM | POA: Diagnosis not present

## 2019-05-31 DIAGNOSIS — Z23 Encounter for immunization: Secondary | ICD-10-CM | POA: Diagnosis not present

## 2019-06-16 ENCOUNTER — Other Ambulatory Visit: Payer: Self-pay | Admitting: Oncology

## 2019-06-20 ENCOUNTER — Other Ambulatory Visit: Payer: Self-pay

## 2019-06-20 ENCOUNTER — Ambulatory Visit
Admission: RE | Admit: 2019-06-20 | Discharge: 2019-06-20 | Disposition: A | Discharge status: Home / Self Care | Payer: Medicare Other | Source: Ambulatory Visit | Attending: Adult Health | Admitting: Adult Health

## 2019-06-20 DIAGNOSIS — C50111 Malignant neoplasm of central portion of right female breast: Secondary | ICD-10-CM

## 2019-06-20 DIAGNOSIS — Z853 Personal history of malignant neoplasm of breast: Secondary | ICD-10-CM | POA: Diagnosis not present

## 2019-06-20 DIAGNOSIS — R928 Other abnormal and inconclusive findings on diagnostic imaging of breast: Secondary | ICD-10-CM | POA: Diagnosis not present

## 2019-07-04 NOTE — Progress Notes (Signed)
[place]  [phone] [phone]   ID: MADALEN Bell DOB: 1946-07-15  MR#: [id number]  SWN#:[id number]  Patient Care Team: Cyndi Bender, Hershal Coria as PCP - General (Physician Assistant) Magrinat, Virgie Dad, MD as Consulting Physician (Oncology) Coralie Keens, MD as Consulting Physician (General Surgery) Eppie Gibson, MD as Attending Physician (Radiation Oncology) Richmond Campbell, MD as Consulting Physician (Gastroenterology) OTHER MD:  CHIEF COMPLAINT: Estrogen receptor positive breast cancer  CURRENT TREATMENT: Anastrozole   INTERVAL HISTORY: Burnie returns today for follow-up of her estrogen receptor positive breast cancer.   She continues on anastrozole.  She obtains it under very good price.  She does have some hot flashes.  They do not wake her up at night however.  She thinks she feels a little bit more tired perhaps than her usual.  Vaginal dryness is not a major issue.  Cyara's last bone density screening on 08/10/2018, showed a T-score of -1.9, which is considered osteopenic.    Since her last visit, she underwent bilateral diagnostic mammography with tomography at [facility] on 06/20/2019 showing: breast density category B; no evidence of malignancy in either breast.   REVIEW OF SYSTEMS: [name] is taking appropriate pandemic precautions.  Her children did visit for Thanksgiving's and they do work outside the home but she tells me they distanced.  They did not wear masks.  She has not had any symptoms suggestive of the virus however.  She is also taking Fosamax and tolerating that well.  For exercise she does treadmill 30 minutes a day and sometimes does the bike 10 minutes in addition.  A detailed review of systems today was otherwise stable   HISTORY OF CURRENT ILLNESS: From the original intake note:  Emily Bell presented with focal pain in the retroareolar right breast as well as in the inferior left breast near the  inframammary fold. She originally had a routine screening in 08/2018, but called and had her mammography pushed up. She underwent bilateral diagnostic mammography with tomography and bilateral breast ultrasonography at [facility] on 06/17/2018 showing: Breast Density Category B. In the lower slightly inner quadrant of the right breast there is an irregular mass which on the CC view appears to be associated with some distortion. The mass measures approximately 1 cm.  On physical exam, no discrete palpable masses are identified in the medial right breast. Ultrasound of the right breast at 3 o'clock, 1 cm from the nipple demonstrates an irregular hypoechoic mass measuring 1.1 x 0.5 x 0.7 cm. Ultrasound of the right axilla demonstrates multiple normal-appearing lymph nodes.   There are no suspicious findings seen at the site of focal pain in the anterior left breast. Ultrasound of the left breast at the site of pain demonstrates normal fibroglandular tissue. No masses or suspicious areas of shadowing are identified.   Accordingly on 06/21/2018 she proceeded to biopsy of the right breast area in question. The pathology from this procedure showed ([id number]): invasive mammary carcinoma, Grade II - III, and a mammary carcinoma in situ, intermediate nuclear grade. E-cadherin is positive, consistent with a ductal phenotype. Prognostic indicators significant for: estrogen receptor, 90% positive and progesterone receptor, 90% positive, both with strong staining intensity. Proliferation marker Ki67 at 5%. HER2 negative by immunohistochemistry (1+).   On 07/12/2018 she underwent a right breast partial mastectomy. Pathology from this procedure ([id number]) shows an invasive ductal carcinoma, grade II/III, spanning 1.1 cm, and a ductal carcinoma in situ, low grade. Perineural invasion is identified. Ductal  carcinoma in situ is focally 0.1 cm to the anterior margin. Invasive ductal carcinoma is focally less than 0.1  cm to the medial margin. Both sentinel right axillary lymph nodes biopsied were clear.  The patient's subsequent history is as detailed below.   PAST MEDICAL HISTORY: Past Medical History:  Diagnosis Date  . Anemia   . Cancer [facility])    right breast  . Colon polyps   . Hypercholesteremia   . Osteoporosis   . Vitamin B 12 deficiency   . Vitamin D deficiency     PAST SURGICAL HISTORY: Past Surgical History:  Procedure Laterality Date  . BREAST LUMPECTOMY Right 07/12/2018  . BREAST LUMPECTOMY WITH RADIOACTIVE SEED AND SENTINEL LYMPH NODE BIOPSY Right 07/12/2018   Procedure: RIGHT BREAST PARTIAL MASTECTOMY WITH RADIOACTIVE SEED AND SENTINEL LYMPH NODE BIOPSY;  Surgeon: Coralie Keens, MD;  Location: [facility];  Service: General;  Laterality: Right;  . COLONOSCOPY    . neg hx      FAMILY HISTORY: Family History  Problem Relation Age of Onset  . Hypertension Mother   . Stomach cancer Mother   . Cancer Cousin        Breast  . Colon cancer Neg Hx    Manila's father died from pneumonia at age 34. Patients' mother died from "stomach cancer in the lining of her intestines" at age 35. The patient has 2 brothers. The patient has one first cousin with breast cancer. Patient denies anyone in her family having ovarian, prostate or pancreatic cancer.    GYNECOLOGIC HISTORY:  No LMP recorded. Patient is postmenopausal. 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 Before she was a Quarry manager, she worked at [employer]. Her husband, Jeannett Senior, is retired from Therapist, art.  The patient has 3 children: [name], and [name]. Emily Bell is 39, lives in [place], and is retired from the [employer]. Emily Bell is 85, lives in [place], [place], and is a Media planner. Emily Bell is 55, lives in [place], [place], and  drives long distances. [name] has 5 grandchildren, 4 of them in their 87s, 1 (in [place]) 73 years old.  No great grandchildren. She attends [employer].   ADVANCED DIRECTIVES: In the absence of any documentation to the contrary, the patient's spouse is their HCPOA.    HEALTH MAINTENANCE: Social History   Tobacco Use  . Smoking status: Never Smoker  . Smokeless tobacco: Never Used  Substance Use Topics  . Alcohol use: No  . Drug use: No    Colonoscopy: March 2012, [facility]  PAP: 4+ years ago at [facility] density: next scheduled for 08/12/2018  No Known Allergies  Current Outpatient Medications  Medication Sig Dispense Refill  . alendronate (FOSAMAX) 70 MG tablet TAKE 1 TABLET EVERY MONDAY WITH A FULL GLASS OF WATER ON AN EMPTY STOMACH 12 tablet 0  . anastrozole (ARIMIDEX) 1 MG tablet Take 1 tablet (1 mg total) by mouth daily. 90 tablet 4  . aspirin 81 MG tablet Take 81 mg by mouth daily.    . Calcium Carb-Cholecalciferol (CALCIUM 600 + D PO) Take 1 tablet by mouth daily.    . Cholecalciferol (VITAMIN D3) 2000 UNITS TABS Take 2,000 Units by mouth daily.     . Coenzyme Q10 (CO  Q 10 PO) Take 200 mg by mouth daily.     . magnesium oxide (MAG-OX) 400 MG tablet Take 400 mg by mouth daily.    . traMADol (ULTRAM) 50 MG tablet Take 1 tablet (50 mg total) by mouth every 6 (six) hours as needed for moderate pain. 30 tablet 0  . vitamin B-12 (CYANOCOBALAMIN) 1000 MCG tablet Take 1,000 mcg by mouth daily.     No current facility-administered medications for this visit.      OBJECTIVE: Older African-American woman who appears well  Vitals:   07/05/19 1024  BP: 114/65  Pulse: (!) 54  Resp: 18  Temp: 98.7 F (37.1 C)  SpO2: 100%     Body mass index is 24.61 kg/m.   Wt Readings from Last 3 Encounters:  07/05/19 147 lb 14.4 oz (67.1 kg)  09/23/18 147 lb 3.2 oz (66.8 kg)  07/26/18 149 lb 4.8 oz (67.7 kg)      ECOG FS:0 - Asymptomatic  Sclerae unicteric,  EOMs intact Wearing a mask No cervical or supraclavicular adenopathy Lungs no rales or rhonchi Heart regular rate and rhythm Abd soft, nontender, positive bowel sounds MSK no focal spinal tenderness, no upper extremity lymphedema Neuro: nonfocal, well oriented, appropriate affect Breasts: The right breast has undergone lumpectomy.  There is no evidence of disease recurrence.  The left breast is benign.  Both axillae are benign.   LAB RESULTS:  CMP     Component Value Date/Time   NA 142 07/05/2019 1000   K 4.4 07/05/2019 1000   CL 109 07/05/2019 1000   CO2 25 07/05/2019 1000   GLUCOSE 106 (H) 07/05/2019 1000   BUN 9 07/05/2019 1000   CREATININE 0.92 07/05/2019 1000   CREATININE 0.93 07/26/2018 1459   CALCIUM 8.9 07/05/2019 1000   PROT 6.5 07/05/2019 1000   ALBUMIN 3.9 07/05/2019 1000   AST 17 07/05/2019 1000   AST 18 07/26/2018 1459   ALT 18 07/05/2019 1000   ALT 23 07/26/2018 1459   ALKPHOS 33 (L) 07/05/2019 1000   BILITOT 0.6 07/05/2019 1000   BILITOT 0.5 07/26/2018 1459   GFRNONAA >60 07/05/2019 1000   GFRNONAA >60 07/26/2018 1459   GFRAA >60 07/05/2019 1000   GFRAA >60 07/26/2018 1459    No results found for: TOTALPROTELP, ALBUMINELP, A1GS, A2GS, BETS, BETA2SER, GAMS, MSPIKE, SPEI  No results found for: KPAFRELGTCHN, LAMBDASER, KAPLAMBRATIO  Lab Results  Component Value Date   WBC 4.5 07/05/2019   NEUTROABS 2.4 07/05/2019   HGB 12.1 07/05/2019   HCT 38.3 07/05/2019   MCV 83.8 07/05/2019   PLT 245 07/05/2019    No results found for: LABCA2  No components found for: LEXNTZ001  No results for input(s): INR in the last 168 hours.  No results found for: LABCA2  No results found for: VCB449  No results found for: QPR916  No results found for: BWG665  No results found for: CA2729  No components found for: HGQUANT  No results found for: CEA1 / No results found for: CEA1   No results found for: AFPTUMOR  No results found for: CHROMOGRNA  No  results found for: PSA1  Appointment on 07/05/2019  Component Date Value Ref Range Status  . Sodium 07/05/2019 142  135 - 145 mmol/L Final  . Potassium 07/05/2019 4.4  3.5 - 5.1 mmol/L Final  . Chloride 07/05/2019 109  98 - 111 mmol/L Final  . CO2 07/05/2019 25  22 - 32 mmol/L Final  . Glucose, Bld 07/05/2019  106* 70 - 99 mg/dL Final  . BUN 07/05/2019 9  8 - 23 mg/dL Final  . Creatinine, Ser 07/05/2019 0.92  0.44 - 1.00 mg/dL Final  . Calcium 07/05/2019 8.9  8.9 - 10.3 mg/dL Final  . Total Protein 07/05/2019 6.5  6.5 - 8.1 g/dL Final  . Albumin 07/05/2019 3.9  3.5 - 5.0 g/dL Final  . AST 07/05/2019 17  15 - 41 U/L Final  . ALT 07/05/2019 18  0 - 44 U/L Final  . Alkaline Phosphatase 07/05/2019 33* 38 - 126 U/L Final  . Total Bilirubin 07/05/2019 0.6  0.3 - 1.2 mg/dL Final  . GFR calc non Af Amer 07/05/2019 >60  >60 mL/min Final  . GFR calc Af Amer 07/05/2019 >60  >60 mL/min Final  . Anion gap 07/05/2019 8  5 - 15 Final   Performed at [facility], [place] [address] [place], [place] [address]  . WBC 07/05/2019 4.5  4.0 - 10.5 K/uL Final  . RBC 07/05/2019 4.57  3.87 - 5.11 MIL/uL Final  . Hemoglobin 07/05/2019 12.1  12.0 - 15.0 g/dL Final  . HCT 07/05/2019 38.3  36.0 - 46.0 % Final  . MCV 07/05/2019 83.8  80.0 - 100.0 fL Final  . MCH 07/05/2019 26.5  26.0 - 34.0 pg Final  . MCHC 07/05/2019 31.6  30.0 - 36.0 g/dL Final  . RDW 07/05/2019 13.7  11.5 - 15.5 % Final  . Platelets 07/05/2019 245  150 - 400 K/uL Final  . nRBC 07/05/2019 0.0  0.0 - 0.2 % Final  . Neutrophils Relative % 07/05/2019 53  % Final  . Neutro Abs 07/05/2019 2.4  1.7 - 7.7 K/uL Final  . Lymphocytes Relative 07/05/2019 34  % Final  . Lymphs Abs 07/05/2019 1.5  0.7 - 4.0 K/uL Final  . Monocytes Relative 07/05/2019 8  % Final  . Monocytes Absolute 07/05/2019 0.4  0.1 - 1.0 K/uL Final  . Eosinophils Relative 07/05/2019 4  % Final  . Eosinophils Absolute 07/05/2019 0.2  0.0 - 0.5 K/uL Final  .  Basophils Relative 07/05/2019 1  % Final  . Basophils Absolute 07/05/2019 0.0  0.0 - 0.1 K/uL Final  . Immature Granulocytes 07/05/2019 0  % Final  . Abs Immature Granulocytes 07/05/2019 0.01  0.00 - 0.07 K/uL Final   Performed at [facility], [place] [address] [place], [place] [address]    (this displays the last labs from the last 3 days)  No results found for: TOTALPROTELP, ALBUMINELP, A1GS, A2GS, BETS, BETA2SER, GAMS, MSPIKE, SPEI (this displays SPEP labs)  No results found for: KPAFRELGTCHN, LAMBDASER, KAPLAMBRATIO (kappa/lambda light chains)  No results found for: HGBA, HGBA2QUANT, HGBFQUANT, HGBSQUAN (Hemoglobinopathy evaluation)   No results found for: LDH  No results found for: IRON, TIBC, IRONPCTSAT (Iron and TIBC)  No results found for: FERRITIN  Urinalysis No results found for: COLORURINE, APPEARANCEUR, LABSPEC, PHURINE, GLUCOSEU, HGBUR, BILIRUBINUR, KETONESUR, PROTEINUR, UROBILINOGEN, NITRITE, LEUKOCYTESUR   STUDIES:  Mm Diag Breast Tomo Bilateral  Result Date: 06/20/2019 CLINICAL DATA:  73 year old female for annual follow-up. History of RIGHT breast cancer and lumpectomy in 2019. EXAM: DIGITAL DIAGNOSTIC BILATERAL MAMMOGRAM WITH CAD AND TOMO COMPARISON:  Previous exam(s). ACR Breast Density Category b: There are scattered areas of fibroglandular density. FINDINGS: 2D and 3D full field views of both breasts, a magnification view of the lumpectomy site and spot compression views of the LEFT breast demonstrate no suspicious mass, nonsurgical distortion or worrisome calcifications. Lumpectomy  changes within the LOWER RIGHT breast noted. Mammographic images were processed with CAD. IMPRESSION: No mammographic evidence of breast malignancy. RECOMMENDATION: Bilateral diagnostic mammogram in 1 year. I have discussed the findings and recommendations with the patient. If applicable, a reminder letter will be sent to the patient regarding the next  appointment. BI-RADS CATEGORY  2: Benign. Electronically Signed   By: Margarette Canada M.D.   On: 06/20/2019 09:22     ELIGIBLE FOR AVAILABLE RESEARCH PROTOCOL: no   ASSESSMENT: 73 y.o. [place], [place] woman status post right breast central biopsy 06/21/2018 for clinical T1c N0, stage IA invasive ductal carcinoma, grade 2 or 3, estrogen and progesterone receptor positive, HER-2 not amplified, with an MIB-1 of 5%  (1) status post right lumpectomy 07/12/2018 for a pT1b pN0, stage IA invasive ductal carcinoma, grade 2, with close but negative margins.  There was evidence of perineural invasion.  A total of 2 sentinel lymph nodes were removed  (2) opted against adjuvant radiation  (3) anastrozole started 08/04/2018  (a) bone density 08/10/2018 showed a T score of -1.9  (b) on alendronate weekly  PLAN: Callia is now a year out from definitive surgery for her breast cancer with no evidence of disease recurrence.  This is very favorable.  She is tolerating anastrozole well and the plan will be to continue that a total of 5 years.  She is also tolerating alendronate without any unusual side effects  She is taking appropriate pandemic precautions.  She will have a repeat mammogram next November and see me again a year from now  She knows to call for any other issue that may develop before that visit.  Magrinat, Virgie Dad, MD  07/05/19 10:57 AM Medical Oncology and Hematology [facility] [place], [place] [address] Tel. [phone]    Fax. [phone]    I, Wilburn Mylar, am acting as scribe for Dr. Virgie Dad. Magrinat.  I, Lurline Del MD, have reviewed the above documentation for accuracy and completeness, and I agree with the above.

## 2019-07-05 ENCOUNTER — Inpatient Hospital Stay: Payer: Medicare Other | Attending: Oncology | Admitting: Oncology

## 2019-07-05 ENCOUNTER — Other Ambulatory Visit: Payer: Self-pay

## 2019-07-05 ENCOUNTER — Inpatient Hospital Stay: Payer: Medicare Other

## 2019-07-05 VITALS — BP 114/65 | HR 54 | Temp 98.7°F | Resp 18 | Ht 65.0 in | Wt 147.9 lb

## 2019-07-05 DIAGNOSIS — Z170 Estrogen receptor positive status [ER+]: Secondary | ICD-10-CM

## 2019-07-05 DIAGNOSIS — C50111 Malignant neoplasm of central portion of right female breast: Secondary | ICD-10-CM

## 2019-07-05 DIAGNOSIS — Z79811 Long term (current) use of aromatase inhibitors: Secondary | ICD-10-CM | POA: Diagnosis not present

## 2019-07-05 LAB — COMPREHENSIVE METABOLIC PANEL
ALT: 18 U/L (ref 0–44)
AST: 17 U/L (ref 15–41)
Albumin: 3.9 g/dL (ref 3.5–5.0)
Alkaline Phosphatase: 33 U/L — ABNORMAL LOW (ref 38–126)
Anion gap: 8 (ref 5–15)
BUN: 9 mg/dL (ref 8–23)
CO2: 25 mmol/L (ref 22–32)
Calcium: 8.9 mg/dL (ref 8.9–10.3)
Chloride: 109 mmol/L (ref 98–111)
Creatinine, Ser: 0.92 mg/dL (ref 0.44–1.00)
GFR calc Af Amer: >60 mL/min (ref >60)
GFR calc non Af Amer: >60 mL/min (ref >60)
Glucose, Bld: 106 mg/dL — ABNORMAL HIGH (ref 70–99)
Potassium: 4.4 mmol/L (ref 3.5–5.1)
Sodium: 142 mmol/L (ref 135–145)
Total Bilirubin: 0.6 mg/dL (ref 0.3–1.2)
Total Protein: 6.5 g/dL (ref 6.5–8.1)

## 2019-07-05 LAB — CBC WITH DIFFERENTIAL/PLATELET
Abs Immature Granulocytes: 0.01 10*3/uL (ref 0.00–0.07)
Basophils Absolute: 0 10*3/uL (ref 0.0–0.1)
Basophils Relative: 1 %
Eosinophils Absolute: 0.2 10*3/uL (ref 0.0–0.5)
Eosinophils Relative: 4 %
HCT: 38.3 % (ref 36.0–46.0)
Hemoglobin: 12.1 g/dL (ref 12.0–15.0)
Immature Granulocytes: 0 %
Lymphocytes Relative: 34 %
Lymphs Abs: 1.5 10*3/uL (ref 0.7–4.0)
MCH: 26.5 pg (ref 26.0–34.0)
MCHC: 31.6 g/dL (ref 30.0–36.0)
MCV: 83.8 fL (ref 80.0–100.0)
Monocytes Absolute: 0.4 10*3/uL (ref 0.1–1.0)
Monocytes Relative: 8 %
Neutro Abs: 2.4 10*3/uL (ref 1.7–7.7)
Neutrophils Relative %: 53 %
Platelets: 245 10*3/uL (ref 150–400)
RBC: 4.57 MIL/uL (ref 3.87–5.11)
RDW: 13.7 % (ref 11.5–15.5)
WBC: 4.5 10*3/uL (ref 4.0–10.5)
nRBC: 0 % (ref 0.0–0.2)

## 2019-07-05 MED ORDER — ANASTROZOLE 1 MG PO TABS: 1.0000 mg | ORAL_TABLET | Freq: Every day | ORAL | 4 refills | Status: DC

## 2019-07-06 ENCOUNTER — Telehealth: Payer: Self-pay | Admitting: Oncology

## 2019-07-06 NOTE — Telephone Encounter (Signed)
I left a message regarding schedule  

## 2019-07-12 ENCOUNTER — Encounter: Payer: Self-pay | Admitting: *Deleted

## 2019-08-22 DIAGNOSIS — Z23 Encounter for immunization: Secondary | ICD-10-CM | POA: Diagnosis not present

## 2019-09-01 ENCOUNTER — Other Ambulatory Visit: Payer: Self-pay | Admitting: Oncology

## 2019-11-17 ENCOUNTER — Other Ambulatory Visit: Payer: Self-pay | Admitting: Oncology

## 2020-01-05 DIAGNOSIS — C50911 Malignant neoplasm of unspecified site of right female breast: Secondary | ICD-10-CM | POA: Diagnosis not present

## 2020-02-12 ENCOUNTER — Other Ambulatory Visit: Payer: Self-pay | Admitting: Oncology

## 2020-03-15 ENCOUNTER — Other Ambulatory Visit: Payer: Self-pay | Admitting: Adult Health

## 2020-03-15 DIAGNOSIS — Z1231 Encounter for screening mammogram for malignant neoplasm of breast: Secondary | ICD-10-CM

## 2020-04-03 DIAGNOSIS — Z6825 Body mass index (BMI) 25.0-25.9, adult: Secondary | ICD-10-CM | POA: Diagnosis not present

## 2020-04-03 DIAGNOSIS — D649 Anemia, unspecified: Secondary | ICD-10-CM | POA: Diagnosis not present

## 2020-04-03 DIAGNOSIS — Z139 Encounter for screening, unspecified: Secondary | ICD-10-CM | POA: Diagnosis not present

## 2020-04-03 DIAGNOSIS — E7800 Pure hypercholesterolemia, unspecified: Secondary | ICD-10-CM | POA: Diagnosis not present

## 2020-04-03 DIAGNOSIS — E559 Vitamin D deficiency, unspecified: Secondary | ICD-10-CM | POA: Diagnosis not present

## 2020-04-24 DIAGNOSIS — Z1212 Encounter for screening for malignant neoplasm of rectum: Secondary | ICD-10-CM | POA: Diagnosis not present

## 2020-04-24 DIAGNOSIS — Z1211 Encounter for screening for malignant neoplasm of colon: Secondary | ICD-10-CM | POA: Diagnosis not present

## 2020-04-27 DIAGNOSIS — H25813 Combined forms of age-related cataract, bilateral: Secondary | ICD-10-CM | POA: Diagnosis not present

## 2020-05-15 DIAGNOSIS — Z23 Encounter for immunization: Secondary | ICD-10-CM | POA: Diagnosis not present

## 2020-05-28 DIAGNOSIS — Z7189 Other specified counseling: Secondary | ICD-10-CM | POA: Diagnosis not present

## 2020-05-28 DIAGNOSIS — Z23 Encounter for immunization: Secondary | ICD-10-CM | POA: Diagnosis not present

## 2020-05-31 ENCOUNTER — Other Ambulatory Visit: Payer: Self-pay | Admitting: Oncology

## 2020-06-20 ENCOUNTER — Ambulatory Visit: Payer: Medicare Other

## 2020-06-27 ENCOUNTER — Other Ambulatory Visit: Payer: Self-pay | Admitting: Adult Health

## 2020-06-27 DIAGNOSIS — Z853 Personal history of malignant neoplasm of breast: Secondary | ICD-10-CM

## 2020-07-03 NOTE — Progress Notes (Signed)
[place]  [phone] [phone]   ID: Emily Bell DOB: Jul 01, 1946  MR#: [id number]  HQI#:[id number]  Patient Care Team: Cyndi Bender, Hershal Coria as PCP - General (Physician Assistant) Brittania Sudbeck, Virgie Dad, MD as Consulting Physician (Oncology) Coralie Keens, MD as Consulting Physician (General Surgery) Eppie Gibson, MD as Attending Physician (Radiation Oncology) Richmond Campbell, MD as Consulting Physician (Gastroenterology) OTHER MD:  CHIEF COMPLAINT: Estrogen receptor positive breast cancer  CURRENT TREATMENT: Anastrozole   INTERVAL HISTORY: Emily Bell returns today for follow-up of her estrogen receptor positive breast cancer.   She continues on anastrozole.  She obtains it under very good price.  She does have some hot flashes.  They do not wake her up at night however.  She thinks she feels a little bit more tired perhaps than her usual.  Vaginal dryness is not a major issue.  Emily Bell's last bone density screening on 08/10/2018, showed a T-score of -1.9, which is considered osteopenic.    She is scheduled for annual diagnostic mammography on 07/11/2020.   REVIEW OF SYSTEMS: Emily Bell is doing remarkably well, she enjoyed Thanksgiving's, had a son and 4 grandchildren there.  She walks 2 to 3 miles 2-3 times a week and does a lot of yard work.  Detailed review of systems today was stable.   COVID 19 VACCINATION STATUS:    HISTORY OF CURRENT ILLNESS: From the original intake note:  Emily Bell presented with focal pain in the retroareolar right breast as well as in the inferior left breast near the inframammary fold. She originally had a routine screening in 08/2018, but called and had her mammography pushed up. She underwent bilateral diagnostic mammography with tomography and bilateral breast ultrasonography at [facility] on 06/17/2018 showing: Breast Density Category B. In the lower slightly inner quadrant of the right breast there  is an irregular mass which on the CC view appears to be associated with some distortion. The mass measures approximately 1 cm.  On physical exam, no discrete palpable masses are identified in the medial right breast. Ultrasound of the right breast at 3 o'clock, 1 cm from the nipple demonstrates an irregular hypoechoic mass measuring 1.1 x 0.5 x 0.7 cm. Ultrasound of the right axilla demonstrates multiple normal-appearing lymph nodes.   There are no suspicious findings seen at the site of focal pain in the anterior left breast. Ultrasound of the left breast at the site of pain demonstrates normal fibroglandular tissue. No masses or suspicious areas of shadowing are identified.   Accordingly on 06/21/2018 she proceeded to biopsy of the right breast area in question. The pathology from this procedure showed ([id number]): invasive mammary carcinoma, Grade II - III, and a mammary carcinoma in situ, intermediate nuclear grade. E-cadherin is positive, consistent with a ductal phenotype. Prognostic indicators significant for: estrogen receptor, 90% positive and progesterone receptor, 90% positive, both with strong staining intensity. Proliferation marker Ki67 at 5%. HER2 negative by immunohistochemistry (1+).   On 07/12/2018 she underwent a right breast partial mastectomy. Pathology from this procedure ([id number]) shows an invasive ductal carcinoma, grade II/III, spanning 1.1 cm, and a ductal carcinoma in situ, low grade. Perineural invasion is identified. Ductal carcinoma in situ is focally 0.1 cm to the anterior margin. Invasive ductal carcinoma is focally less than 0.1 cm to the medial margin. Both sentinel right axillary lymph nodes biopsied were clear.  The patient's subsequent history is as detailed below.   PAST MEDICAL HISTORY: Past Medical History:  Diagnosis Date  .  Anemia   . Cancer [facility])    right breast  . Colon polyps   . Hypercholesteremia   . Osteoporosis   . Vitamin B 12 deficiency   .  Vitamin D deficiency     PAST SURGICAL HISTORY: Past Surgical History:  Procedure Laterality Date  . BREAST LUMPECTOMY Right 07/12/2018  . BREAST LUMPECTOMY WITH RADIOACTIVE SEED AND SENTINEL LYMPH NODE BIOPSY Right 07/12/2018   Procedure: RIGHT BREAST PARTIAL MASTECTOMY WITH RADIOACTIVE SEED AND SENTINEL LYMPH NODE BIOPSY;  Surgeon: Coralie Keens, MD;  Location: [facility];  Service: General;  Laterality: Right;  . COLONOSCOPY    . neg hx      FAMILY HISTORY: Family History  Problem Relation Age of Onset  . Hypertension Mother   . Stomach cancer Mother   . Cancer Cousin        Breast  . Colon cancer Neg Hx    Emily Bell's father died from pneumonia at age 36. Patients' mother died from "stomach cancer in the lining of her intestines" at age 35. The patient has 2 brothers. The patient has one first cousin with breast cancer. Patient denies anyone in her family having ovarian, prostate or pancreatic cancer.    GYNECOLOGIC HISTORY:  No LMP recorded. Patient is postmenopausal. 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 Her husband, Emily Bell, is retired from Therapist, art.  The patient has 3 children: Emily Bell, and Emily Bell. Emily Bell is 2, lives in [place], and is retired from the [employer]. Emily Bell is 4, lives in [place], [place], and is a Media planner. Emily Bell is 50, lives in [place], [place], and drives long distances. Emily Bell has 5 grandchildren, 4 of them in their 50s, 1 (in [place]) 74 years old.  No great grandchildren. She attends [employer].   ADVANCED DIRECTIVES: In the absence of any documentation to the contrary, the patient's spouse is their HCPOA.    HEALTH MAINTENANCE: Social History   Tobacco Use  .  Smoking status: Never Smoker  . Smokeless tobacco: Never Used  Vaping Use  . Vaping Use: Never used  Substance Use Topics  . Alcohol use: No  . Drug use: No    Colonoscopy: March 2012, [facility]  PAP: 4+ years ago at [place] density: next scheduled for 08/12/2018  No Known Allergies  Current Outpatient Medications  Medication Sig Dispense Refill  . alendronate (FOSAMAX) 70 MG tablet TAKE 1 TABLET EVERY MONDAY WITH A FULL GLASS OF WATER ON AN EMPTY STOMACH 12 tablet 0  . anastrozole (ARIMIDEX) 1 MG tablet Take 1 tablet (1 mg total) by mouth daily. 90 tablet 4  . aspirin 81 MG tablet Take 81 mg by mouth daily.    . Calcium Carb-Cholecalciferol (CALCIUM 600 + D PO) Take 1 tablet by mouth daily.    . Cholecalciferol (VITAMIN D3) 2000 UNITS TABS Take 2,000 Units by mouth daily.     . Coenzyme Q10 (CO Q 10 PO) Take 200 mg by mouth daily.     . magnesium oxide (MAG-OX) 400 MG tablet Take 400 mg by mouth daily.    . traMADol (ULTRAM) 50 MG tablet Take 1 tablet (50 mg total) by mouth every 6 (six)  hours as needed for moderate pain. 30 tablet 0  . vitamin B-12 (CYANOCOBALAMIN) 1000 MCG tablet Take 1,000 mcg by mouth daily.     No current facility-administered medications for this visit.     OBJECTIVE: Older African-American woman who appears well  Vitals:   07/04/20 0955  BP: 113/63  Pulse: 61  Resp: 18  Temp: 98.1 F (36.7 C)  SpO2: 100%     Body mass index is 24.66 kg/m.   Wt Readings from Last 3 Encounters:  07/04/20 148 lb 3.2 oz (67.2 kg)  07/05/19 147 lb 14.4 oz (67.1 kg)  09/23/18 147 lb 3.2 oz (66.8 kg)      ECOG FS:0 - Asymptomatic  Sclerae unicteric, EOMs intact Wearing a mask No cervical or supraclavicular adenopathy Lungs no rales or rhonchi Heart regular rate and rhythm Abd soft, nontender, positive bowel sounds MSK no focal spinal tenderness, no upper extremity lymphedema Neuro: nonfocal, well oriented, appropriate affect Breasts: The right  breast is status post lumpectomy with no evidence of disease recurrence left breast is benign.  Both axillae are benign.   LAB RESULTS:  CMP     Component Value Date/Time   NA 142 07/05/2019 1000   K 4.4 07/05/2019 1000   CL 109 07/05/2019 1000   CO2 25 07/05/2019 1000   GLUCOSE 106 (H) 07/05/2019 1000   BUN 9 07/05/2019 1000   CREATININE 0.92 07/05/2019 1000   CREATININE 0.93 07/26/2018 1459   CALCIUM 8.9 07/05/2019 1000   PROT 6.5 07/05/2019 1000   ALBUMIN 3.9 07/05/2019 1000   AST 17 07/05/2019 1000   AST 18 07/26/2018 1459   ALT 18 07/05/2019 1000   ALT 23 07/26/2018 1459   ALKPHOS 33 (L) 07/05/2019 1000   BILITOT 0.6 07/05/2019 1000   BILITOT 0.5 07/26/2018 1459   GFRNONAA >60 07/05/2019 1000   GFRNONAA >60 07/26/2018 1459   GFRAA >60 07/05/2019 1000   GFRAA >60 07/26/2018 1459    Lab Results  Component Value Date   WBC 4.8 07/04/2020   NEUTROABS 2.4 07/04/2020   HGB 12.0 07/04/2020   HCT 38.7 07/04/2020   MCV 83.9 07/04/2020   PLT 278 07/04/2020    No results found for: LABCA2  No components found for: [id number]  No results for input(s): INR in the last 168 hours.  No results found for: LABCA2  No results found for: WUJ811  No results found for: BJY782  No results found for: NFA213  No results found for: CA2729  No components found for: HGQUANT  No results found for: CEA1 / No results found for: CEA1   No results found for: AFPTUMOR  No results found for: CHROMOGRNA  No results found for: TOTALPROTELP, ALBUMINELP, A1GS, A2GS, BETS, BETA2SER, GAMS, MSPIKE, SPEI (this displays SPEP labs)  No results found for: KPAFRELGTCHN, LAMBDASER, KAPLAMBRATIO (kappa/lambda light chains)  No results found for: HGBA, HGBA2QUANT, HGBFQUANT, HGBSQUAN (Hemoglobinopathy evaluation)   No results found for: LDH  No results found for: IRON, TIBC, IRONPCTSAT (Iron and TIBC)  No results found for: FERRITIN  Urinalysis No results found for: COLORURINE,  APPEARANCEUR, LABSPEC, PHURINE, GLUCOSEU, HGBUR, BILIRUBINUR, KETONESUR, PROTEINUR, UROBILINOGEN, NITRITE, LEUKOCYTESUR   STUDIES:  No results found.   ELIGIBLE FOR AVAILABLE RESEARCH PROTOCOL: no   ASSESSMENT: 73 y.o. [place], [place] woman status post right breast central biopsy 06/21/2018 for clinical T1c N0, stage IA invasive ductal carcinoma, grade 2 or 3, estrogen and progesterone receptor positive, HER-2 not amplified, with an MIB-1 of 5%  (  1) status post right lumpectomy 07/12/2018 for a pT1b pN0, stage IA invasive ductal carcinoma, grade 2, with close but negative margins.  There was evidence of perineural invasion.  A total of 2 sentinel lymph nodes were removed  (2) opted against adjuvant radiation  (3) anastrozole started 08/04/2018  (a) bone density 08/10/2018 showed a T score of -1.9  (b) on alendronate weekly   PLAN: Cali is now 2 years out from definitive surgery for breast cancer with no evidence of disease recurrence.  This is very favorable.  She is tolerating anastrozole well and the plan is to continue that a total of 5 years.  She is on alendronate for her osteopenia.  We do need to repeat a bone density.  We will try to schedule that for April 2022.  We will have a virtual visit sometime in May to discuss that  She will have her mammography later this month  She knows to call for any other issue that may develop before the next visit.  Total encounter time 25 minutes.*  Stillman Buenger, Virgie Dad, MD  07/04/20 10:14 AM Medical Oncology and Hematology [facility] [place], [place] [address] Tel. [phone]    Fax. [phone]    I, Wilburn Mylar, am acting as scribe for Dr. Virgie Dad. Stefan Karen.  I, Lurline Del MD, have reviewed the above documentation for accuracy and completeness, and I agree with the above.   *Total Encounter Time as defined by the Centers for Medicare and Medicaid Services includes, in addition to the  face-to-face time of a patient visit (documented in the note above) non-face-to-face time: obtaining and reviewing outside history, ordering and reviewing medications, tests or procedures, care coordination (communications with other health care professionals or caregivers) and documentation in the medical record.

## 2020-07-04 ENCOUNTER — Inpatient Hospital Stay: Payer: Medicare Other | Attending: Oncology | Admitting: Oncology

## 2020-07-04 ENCOUNTER — Inpatient Hospital Stay: Payer: Medicare Other

## 2020-07-04 ENCOUNTER — Other Ambulatory Visit: Payer: Self-pay

## 2020-07-04 VITALS — BP 113/63 | HR 61 | Temp 98.1°F | Resp 18 | Ht 65.0 in | Wt 148.2 lb

## 2020-07-04 DIAGNOSIS — C50211 Malignant neoplasm of upper-inner quadrant of right female breast: Secondary | ICD-10-CM | POA: Insufficient documentation

## 2020-07-04 DIAGNOSIS — C50111 Malignant neoplasm of central portion of right female breast: Secondary | ICD-10-CM | POA: Diagnosis not present

## 2020-07-04 DIAGNOSIS — Z79899 Other long term (current) drug therapy: Secondary | ICD-10-CM | POA: Diagnosis not present

## 2020-07-04 DIAGNOSIS — E7800 Pure hypercholesterolemia, unspecified: Secondary | ICD-10-CM | POA: Diagnosis not present

## 2020-07-04 DIAGNOSIS — E538 Deficiency of other specified B group vitamins: Secondary | ICD-10-CM | POA: Diagnosis not present

## 2020-07-04 DIAGNOSIS — E559 Vitamin D deficiency, unspecified: Secondary | ICD-10-CM | POA: Diagnosis not present

## 2020-07-04 DIAGNOSIS — Z86010 Personal history of colonic polyps: Secondary | ICD-10-CM | POA: Diagnosis not present

## 2020-07-04 DIAGNOSIS — Z7982 Long term (current) use of aspirin: Secondary | ICD-10-CM | POA: Insufficient documentation

## 2020-07-04 DIAGNOSIS — R232 Flushing: Secondary | ICD-10-CM | POA: Diagnosis not present

## 2020-07-04 DIAGNOSIS — Z79811 Long term (current) use of aromatase inhibitors: Secondary | ICD-10-CM | POA: Diagnosis not present

## 2020-07-04 DIAGNOSIS — M810 Age-related osteoporosis without current pathological fracture: Secondary | ICD-10-CM | POA: Insufficient documentation

## 2020-07-04 DIAGNOSIS — Z170 Estrogen receptor positive status [ER+]: Secondary | ICD-10-CM | POA: Insufficient documentation

## 2020-07-04 LAB — CBC WITH DIFFERENTIAL/PLATELET
Abs Immature Granulocytes: 0.01 10*3/uL (ref 0.00–0.07)
Basophils Absolute: 0.1 10*3/uL (ref 0.0–0.1)
Basophils Relative: 1 %
Eosinophils Absolute: 0.2 10*3/uL (ref 0.0–0.5)
Eosinophils Relative: 5 %
HCT: 38.7 % (ref 36.0–46.0)
Hemoglobin: 12 g/dL (ref 12.0–15.0)
Immature Granulocytes: 0 %
Lymphocytes Relative: 35 %
Lymphs Abs: 1.7 10*3/uL (ref 0.7–4.0)
MCH: 26 pg (ref 26.0–34.0)
MCHC: 31 g/dL (ref 30.0–36.0)
MCV: 83.9 fL (ref 80.0–100.0)
Monocytes Absolute: 0.4 10*3/uL (ref 0.1–1.0)
Monocytes Relative: 9 %
Neutro Abs: 2.4 10*3/uL (ref 1.7–7.7)
Neutrophils Relative %: 50 %
Platelets: 278 10*3/uL (ref 150–400)
RBC: 4.61 MIL/uL (ref 3.87–5.11)
RDW: 14.2 % (ref 11.5–15.5)
WBC: 4.8 10*3/uL (ref 4.0–10.5)
nRBC: 0 % (ref 0.0–0.2)

## 2020-07-04 LAB — COMPREHENSIVE METABOLIC PANEL
ALT: 16 U/L (ref 0–44)
AST: 18 U/L (ref 15–41)
Albumin: 3.7 g/dL (ref 3.5–5.0)
Alkaline Phosphatase: 35 U/L — ABNORMAL LOW (ref 38–126)
Anion gap: 6 (ref 5–15)
BUN: 10 mg/dL (ref 8–23)
CO2: 21 mmol/L — ABNORMAL LOW (ref 22–32)
Calcium: 8.8 mg/dL — ABNORMAL LOW (ref 8.9–10.3)
Chloride: 111 mmol/L (ref 98–111)
Creatinine, Ser: 0.91 mg/dL (ref 0.44–1.00)
GFR, Estimated: >60 mL/min (ref >60)
Glucose, Bld: 84 mg/dL (ref 70–99)
Potassium: 4.2 mmol/L (ref 3.5–5.1)
Sodium: 138 mmol/L (ref 135–145)
Total Bilirubin: 0.7 mg/dL (ref 0.3–1.2)
Total Protein: 6.5 g/dL (ref 6.5–8.1)

## 2020-07-09 ENCOUNTER — Telehealth: Payer: Self-pay | Admitting: Oncology

## 2020-07-09 NOTE — Telephone Encounter (Signed)
Scheduled appts per 12/1 los. Pt confirmed appt date and time.

## 2020-07-11 ENCOUNTER — Other Ambulatory Visit: Payer: Self-pay

## 2020-07-11 ENCOUNTER — Ambulatory Visit
Admission: RE | Admit: 2020-07-11 | Discharge: 2020-07-11 | Disposition: A | Discharge status: Home / Self Care | Payer: Medicare Other | Source: Ambulatory Visit | Attending: Adult Health | Admitting: Adult Health

## 2020-07-11 DIAGNOSIS — R922 Inconclusive mammogram: Secondary | ICD-10-CM | POA: Diagnosis not present

## 2020-07-11 DIAGNOSIS — Z853 Personal history of malignant neoplasm of breast: Secondary | ICD-10-CM

## 2020-08-01 ENCOUNTER — Ambulatory Visit: Payer: Medicare Other

## 2020-08-13 DIAGNOSIS — Z1159 Encounter for screening for other viral diseases: Secondary | ICD-10-CM | POA: Diagnosis not present

## 2020-08-27 DIAGNOSIS — E785 Hyperlipidemia, unspecified: Secondary | ICD-10-CM | POA: Diagnosis not present

## 2020-08-27 DIAGNOSIS — Z9181 History of falling: Secondary | ICD-10-CM | POA: Diagnosis not present

## 2020-08-27 DIAGNOSIS — Z1331 Encounter for screening for depression: Secondary | ICD-10-CM | POA: Diagnosis not present

## 2020-08-27 DIAGNOSIS — Z0000 Encounter for general adult medical examination without abnormal findings: Secondary | ICD-10-CM | POA: Diagnosis not present

## 2020-09-07 ENCOUNTER — Ambulatory Visit
Admission: RE | Admit: 2020-09-07 | Discharge: 2020-09-07 | Disposition: A | Discharge status: Home / Self Care | Payer: Medicare Other | Source: Ambulatory Visit | Attending: Oncology | Admitting: Oncology

## 2020-09-07 ENCOUNTER — Other Ambulatory Visit: Payer: Medicare Other

## 2020-09-07 ENCOUNTER — Other Ambulatory Visit: Payer: Self-pay

## 2020-09-07 DIAGNOSIS — Z170 Estrogen receptor positive status [ER+]: Secondary | ICD-10-CM

## 2020-09-07 DIAGNOSIS — C50111 Malignant neoplasm of central portion of right female breast: Secondary | ICD-10-CM

## 2020-09-07 DIAGNOSIS — Z780 Asymptomatic menopausal state: Secondary | ICD-10-CM | POA: Diagnosis not present

## 2020-09-07 DIAGNOSIS — M8588 Other specified disorders of bone density and structure, other site: Secondary | ICD-10-CM | POA: Diagnosis not present

## 2020-10-26 ENCOUNTER — Other Ambulatory Visit: Payer: Self-pay | Admitting: Oncology

## 2020-12-09 NOTE — Progress Notes (Signed)
[place]  [phone]) [phone] [phone]) [phone]   ID: Emily Bell DOB: 07/14/1946  MR#: [id number]  ZJI#:[id number]  Patient Care Team: Cyndi Bender, Hershal Coria as PCP - General (Physician Assistant) Slayden Mennenga, Virgie Dad, MD as Consulting Physician (Oncology) Coralie Keens, MD as Consulting Physician (General Surgery) Eppie Gibson, MD as Attending Physician (Radiation Oncology) Richmond Campbell, MD as Consulting Physician (Gastroenterology) OTHER MD:   CHIEF COMPLAINT: Estrogen receptor positive breast cancer  CURRENT TREATMENT: Anastrozole   INTERVAL HISTORY: I phoned [name] today on both the numbers that we have listed and there was no answer at either phone.  I left a voicemail.    Since her last visit, she underwent bilateral diagnostic mammography with tomography at [facility] on 07/11/2020 showing: breast density category C; no evidence of malignancy in either breast.   She also underwent bone density screening on 09/07/2020 showing a T-score of -1.4, which is considered osteopenic.   REVIEW OF SYSTEMS: [name]    COVID 19 VACCINATION STATUS:    HISTORY OF CURRENT ILLNESS: From the original intake note:  Emily Bell presented with focal pain in the retroareolar right breast as well as in the inferior left breast near the inframammary fold. She originally had a routine screening in 08/2018, but called and had her mammography pushed up. She underwent bilateral diagnostic mammography with tomography and bilateral breast ultrasonography at [facility] on 06/17/2018 showing: Breast Density Category B. In the lower slightly inner quadrant of the right breast there is an irregular mass which on the CC view appears to be associated with some distortion. The mass measures approximately 1 cm.  On physical exam, no discrete palpable masses are identified in the medial right breast. Ultrasound of the right breast at 3 o'clock, 1 cm from the nipple  demonstrates an irregular hypoechoic mass measuring 1.1 x 0.5 x 0.7 cm. Ultrasound of the right axilla demonstrates multiple normal-appearing lymph nodes.   There are no suspicious findings seen at the site of focal pain in the anterior left breast. Ultrasound of the left breast at the site of pain demonstrates normal fibroglandular tissue. No masses or suspicious areas of shadowing are identified.   Accordingly on 06/21/2018 she proceeded to biopsy of the right breast area in question. The pathology from this procedure showed ([id number]): invasive mammary carcinoma, Grade II - III, and a mammary carcinoma in situ, intermediate nuclear grade. E-cadherin is positive, consistent with a ductal phenotype. Prognostic indicators significant for: estrogen receptor, 90% positive and progesterone receptor, 90% positive, both with strong staining intensity. Proliferation marker Ki67 at 5%. HER2 negative by immunohistochemistry (1+).   On 07/12/2018 she underwent a right breast partial mastectomy. Pathology from this procedure ([id number]) shows an invasive ductal carcinoma, grade II/III, spanning 1.1 cm, and a ductal carcinoma in situ, low grade. Perineural invasion is identified. Ductal carcinoma in situ is focally 0.1 cm to the anterior margin. Invasive ductal carcinoma is focally less than 0.1 cm to the medial margin. Both sentinel right axillary lymph nodes biopsied were clear.  The patient's subsequent history is as detailed below.   PAST MEDICAL HISTORY: Past Medical History:  Diagnosis Date  . Anemia   . Cancer [facility])    right breast  . Colon polyps   . Hypercholesteremia   . Osteoporosis   . Vitamin B 12 deficiency   . Vitamin D deficiency     PAST SURGICAL HISTORY: Past Surgical History:  Procedure Laterality Date  . BREAST LUMPECTOMY Right 07/12/2018  .  BREAST LUMPECTOMY WITH RADIOACTIVE SEED AND SENTINEL LYMPH NODE BIOPSY Right 07/12/2018   Procedure: RIGHT BREAST PARTIAL MASTECTOMY  WITH RADIOACTIVE SEED AND SENTINEL LYMPH NODE BIOPSY;  Surgeon: Coralie Keens, MD;  Location: [facility];  Service: General;  Laterality: Right;  . COLONOSCOPY    . neg hx      FAMILY HISTORY: Family History  Problem Relation Age of Onset  . Hypertension Mother   . Stomach cancer Mother   . Cancer Cousin        Breast  . Colon cancer Neg Hx   Annamay's father died from pneumonia at age 61. Patients' mother died from "stomach cancer in the lining of her intestines" at age 34. The patient has 2 brothers. The patient has one first cousin with breast cancer. Patient denies anyone in her family having ovarian, prostate or pancreatic cancer.    GYNECOLOGIC HISTORY:  No LMP recorded. Patient is postmenopausal. 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 Her husband, Jeannett Senior, is retired from Therapist, art.  The patient has 3 children: Emily Bell, and Emily Bell. Emily Bell is 74, lives in [place], and is retired from the [employer]. Emily Bell is 31, lives in [place], [place], and is a Media planner. Emily Bell is 47, lives in [place], [place], and drives long distances. Tobin has 5 grandchildren, 4 of them in their 9s, 1 (in [place]) 75 years old.  No great grandchildren. She attends [employer].   ADVANCED DIRECTIVES: In the absence of any documentation to the contrary, the patient's spouse is their HCPOA.    HEALTH MAINTENANCE: Social History   Tobacco Use  . Smoking status: Never Smoker  . Smokeless tobacco: Never Used  Vaping Use  . Vaping Use: Never used  Substance Use Topics  . Alcohol use: No  . Drug use: No    Colonoscopy: March 2012, [facility]  PAP: 4+ years ago at [facility] density: 09/2020, -1.4  No Known  Allergies  Current Outpatient Medications  Medication Sig Dispense Refill  . alendronate (FOSAMAX) 70 MG tablet TAKE 1 TABLET EVERY MONDAY WITH A FULL GLASS OF WATER ON AN EMPTY STOMACH 12 tablet 0  . anastrozole (ARIMIDEX) 1 MG tablet TAKE 1 TABLET DAILY 90 tablet 3  . aspirin 81 MG tablet Take 81 mg by mouth daily.    . Calcium Carb-Cholecalciferol (CALCIUM 600 + D PO) Take 1 tablet by mouth daily.    . Cholecalciferol (VITAMIN D3) 2000 UNITS TABS Take 2,000 Units by mouth daily.     . Coenzyme Q10 (CO Q 10 PO) Take 200 mg by mouth daily.     . magnesium oxide (MAG-OX) 400 MG tablet Take 400 mg by mouth daily.    . traMADol (ULTRAM) 50 MG tablet Take 1 tablet (50 mg total) by mouth every 6 (six) hours as needed for moderate pain. 30 tablet 0  . vitamin B-12 (CYANOCOBALAMIN) 1000 MCG tablet Take 1,000 mcg by mouth daily.     No current facility-administered medications for this visit.     OBJECTIVE: African-American woman who appears well  There were no vitals filed for this visit.   There is no height or weight on file to calculate BMI.  Wt Readings from Last 3 Encounters:  07/04/20 148 lb 3.2 oz (67.2 kg)  07/05/19 147 lb 14.4 oz (67.1 kg)  09/23/18 147 lb 3.2 oz (66.8 kg)      ECOG FS:0 - Asymptomatic  Telemedicine visit 12/10/2020    LAB RESULTS:  CMP     Component Value Date/Time   NA 138 07/04/2020 0948   K 4.2 07/04/2020 0948   CL 111 07/04/2020 0948   CO2 21 (L) 07/04/2020 0948   GLUCOSE 84 07/04/2020 0948   BUN 10 07/04/2020 0948   CREATININE 0.91 07/04/2020 0948   CREATININE 0.93 07/26/2018 1459   CALCIUM 8.8 (L) 07/04/2020 0948   PROT 6.5 07/04/2020 0948   ALBUMIN 3.7 07/04/2020 0948   AST 18 07/04/2020 0948   AST 18 07/26/2018 1459   ALT 16 07/04/2020 0948   ALT 23 07/26/2018 1459   ALKPHOS 35 (L) 07/04/2020 0948   BILITOT 0.7 07/04/2020 0948   BILITOT 0.5 07/26/2018 1459   GFRNONAA >60 07/04/2020 0948   GFRNONAA >60 07/26/2018 1459   GFRAA >60  07/05/2019 1000   GFRAA >60 07/26/2018 1459    Lab Results  Component Value Date   WBC 4.8 07/04/2020   NEUTROABS 2.4 07/04/2020   HGB 12.0 07/04/2020   HCT 38.7 07/04/2020   MCV 83.9 07/04/2020   PLT 278 07/04/2020    No results found for: LABCA2  No components found for: [id number]  No results for input(s): INR in the last 168 hours.  No results found for: LABCA2  No results found for: MBW466  No results found for: ZLD357  No results found for: SVX793  No results found for: CA2729  No components found for: HGQUANT  No results found for: CEA1 / No results found for: CEA1   No results found for: AFPTUMOR  No results found for: CHROMOGRNA  No results found for: TOTALPROTELP, ALBUMINELP, A1GS, A2GS, BETS, BETA2SER, GAMS, MSPIKE, SPEI (this displays SPEP labs)  No results found for: KPAFRELGTCHN, LAMBDASER, KAPLAMBRATIO (kappa/lambda light chains)  No results found for: HGBA, HGBA2QUANT, HGBFQUANT, HGBSQUAN (Hemoglobinopathy evaluation)   No results found for: LDH  No results found for: IRON, TIBC, IRONPCTSAT (Iron and TIBC)  No results found for: FERRITIN  Urinalysis No results found for: COLORURINE, APPEARANCEUR, LABSPEC, PHURINE, GLUCOSEU, HGBUR, BILIRUBINUR, KETONESUR, PROTEINUR, UROBILINOGEN, NITRITE, LEUKOCYTESUR   STUDIES:  No results found.   ELIGIBLE FOR AVAILABLE RESEARCH PROTOCOL: no   ASSESSMENT: 75 y.o. [place], [place] woman status post right breast central biopsy 06/21/2018 for clinical T1c N0, stage IA invasive ductal carcinoma, grade 2 or 3, estrogen and progesterone receptor positive, HER-2 not amplified, with an MIB-1 of 5%  (1) status post right lumpectomy 07/12/2018 for a pT1b pN0, stage IA invasive ductal carcinoma, grade 2, with close but negative margins.  There was evidence of perineural invasion.  A total of 2 sentinel lymph nodes were removed  (2) opted against adjuvant radiation  (3) anastrozole started 08/04/2018  (a) bone  density 08/10/2018 showed a T score of -1.9  (b) on alendronate weekly   PLAN: Seeley is now 2-1/2 years out from definitive surgery for her breast cancer.  I was not able to contact her today.  We will try again in June and if that fails we will make her an in person visit.   Vicki Pasqual, Virgie Dad, MD  12/10/20 2:10 PM Medical Oncology and Hematology [facility] [place], [place] [address] Tel. [phone]    Fax.  [phone]    I, Wilburn Mylar, am acting as scribe for Dr. Virgie Dad. Thurley Francesconi.  I, Lurline Del MD, have reviewed the above documentation for accuracy and completeness, and I agree with the above.   *Total Encounter Time as defined by the Centers for Medicare and Medicaid Services includes, in addition to the face-to-face time of a patient visit (documented in the note above) non-face-to-face time: obtaining and reviewing outside history, ordering and reviewing medications, tests or procedures, care coordination (communications with other health care professionals or caregivers) and documentation in the medical record.

## 2020-12-10 ENCOUNTER — Inpatient Hospital Stay: Payer: Medicare Other | Attending: Oncology | Admitting: Oncology

## 2020-12-10 DIAGNOSIS — Z170 Estrogen receptor positive status [ER+]: Secondary | ICD-10-CM

## 2020-12-10 DIAGNOSIS — C50111 Malignant neoplasm of central portion of right female breast: Secondary | ICD-10-CM

## 2020-12-11 ENCOUNTER — Telehealth: Payer: Self-pay | Admitting: Oncology

## 2020-12-11 ENCOUNTER — Telehealth: Payer: Self-pay

## 2020-12-11 NOTE — Telephone Encounter (Signed)
R/s appt per 5/10 sch msg. Pt aware.  

## 2020-12-11 NOTE — Telephone Encounter (Signed)
Pt called stating she missed mychart video visit with Dr Jana Hakim 12/10/20, and states she was unaware of appt. Pt is aware this LPN sent message to scheduling to r/s appt, verbalized thanks and understanding.

## 2020-12-18 DIAGNOSIS — Z23 Encounter for immunization: Secondary | ICD-10-CM | POA: Diagnosis not present

## 2020-12-27 ENCOUNTER — Other Ambulatory Visit: Payer: Medicare Other

## 2021-01-28 ENCOUNTER — Inpatient Hospital Stay: Payer: Medicare Other | Attending: Oncology | Admitting: Oncology

## 2021-01-29 ENCOUNTER — Telehealth: Payer: Self-pay | Admitting: Oncology

## 2021-01-29 NOTE — Telephone Encounter (Signed)
R/s appt per 6/28 sch msg. Pt aware.

## 2021-03-06 NOTE — Progress Notes (Signed)
[facility]  [phone] [phone]   ID: Emily Bell DOB: [birth year]  MR#: [id number]  MVH#:[id number]  Patient Care Team: Cyndi Bender, Hershal Coria as PCP - General (Physician Assistant) Ovadia Lopp, Virgie Dad, MD as Consulting Physician (Oncology) Coralie Keens, MD as Consulting Physician (General Surgery) Eppie Gibson, MD as Attending Physician (Radiation Oncology) Richmond Campbell, MD as Consulting Physician (Gastroenterology) OTHER MD:  I connected with Emily Bell on 03/06/21 at 10:00 AM EDT by telephone and verified that I am speaking with the correct person using two identifiers.   I discussed the limitations, risks, security and privacy concerns of performing an evaluation and management service by telemedicine and the availability of in-person appointments. I also discussed with the patient that there may be a patient responsible charge related to this service. The patient expressed understanding and agreed to proceed.   Other persons participating in the visit and their role in the encounter: None  Patient's location: Home Provider's location: [place]  Total time spent: 15 min   CHIEF COMPLAINT: Estrogen receptor positive breast cancer  CURRENT TREATMENT: Anastrozole   INTERVAL HISTORY: Emily Bell was contacted today for follow up of her estrogen receptor positive breast cancer.  She continues on anastrozole.  She obtains it under very good price.  Hot flashes and vaginal dryness are not an issue.  She has not had arthralgias or myalgias from this medication.  Since her last visit, she underwent bilateral diagnostic mammography with tomography at [facility] on 07/11/2020 showing: breast density category C; no evidence of malignancy in either breast.   She also underwent bone density screening on 09/07/2020 showing a T-score of -1.4, which is considered osteopenic.  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  She has been doing a lot of exercise as a result.  She wonders if there is any limitations on the surgical arm and I do not believe there are.  A detailed review of systems today was otherwise noncontributory   COVID 19 VACCINATION STATUS: Status post Moderna x2 followed by booster x2   HISTORY OF CURRENT ILLNESS: From the original intake note:  Emily Bell presented with focal pain in the retroareolar right breast as well as in the inferior left breast near the inframammary fold. She originally had a routine screening in 08/2018, but called and had her mammography pushed up. She underwent bilateral diagnostic mammography with tomography and bilateral breast ultrasonography at [facility] on 06/17/2018 showing: Breast Density Category B. In the lower slightly inner quadrant of the right breast there is an irregular mass which on the CC view appears to be associated with some distortion. The mass measures approximately 1 cm.  On physical exam, no discrete palpable masses are identified in the medial right breast. Ultrasound of the right breast at 3 o'clock, 1 cm from the nipple demonstrates an irregular hypoechoic mass measuring 1.1 x 0.5 x 0.7 cm. Ultrasound of the right axilla demonstrates multiple normal-appearing lymph nodes.   There are no suspicious findings seen at the site of focal pain in the anterior left breast. Ultrasound of the left breast at the site of pain demonstrates normal fibroglandular tissue. No masses or suspicious areas of shadowing are identified.   Accordingly on 06/21/2018 she proceeded to biopsy of the right breast area in question. The pathology from this procedure showed ([id number]): invasive mammary  carcinoma, Grade II - III, and a mammary carcinoma in situ, intermediate nuclear grade. E-cadherin is positive, consistent with a ductal phenotype. Prognostic  indicators significant for: estrogen receptor, 90% positive and progesterone receptor, 90% positive, both with strong staining intensity. Proliferation marker Ki67 at 5%. HER2 negative by immunohistochemistry (1+).   On 07/12/2018 she underwent a right breast partial mastectomy. Pathology from this procedure ([id number]) shows an invasive ductal carcinoma, grade II/III, spanning 1.1 cm, and a ductal carcinoma in situ, low grade. Perineural invasion is identified. Ductal carcinoma in situ is focally 0.1 cm to the anterior margin. Invasive ductal carcinoma is focally less than 0.1 cm to the medial margin. Both sentinel right axillary lymph nodes biopsied were clear.  The patient's subsequent history is as detailed below.   PAST MEDICAL HISTORY: Past Medical History:  Diagnosis Date   Anemia    Cancer ([place])    right breast   Colon polyps    Hypercholesteremia    Osteoporosis    Vitamin B 12 deficiency    Vitamin D deficiency     PAST SURGICAL HISTORY: Past Surgical History:  Procedure Laterality Date   BREAST LUMPECTOMY Right 07/12/2018   BREAST LUMPECTOMY WITH RADIOACTIVE SEED AND SENTINEL LYMPH NODE BIOPSY Right 07/12/2018   Procedure: RIGHT BREAST PARTIAL MASTECTOMY WITH RADIOACTIVE SEED AND SENTINEL LYMPH NODE BIOPSY;  Surgeon: Coralie Keens, MD;  Location: [facility];  Service: General;  Laterality: Right;   COLONOSCOPY     neg hx      FAMILY HISTORY: Family History  Problem Relation Age of Onset   Hypertension Mother    Stomach cancer Mother    Cancer Cousin        Breast   Colon cancer Neg Hx   Emily Bell's father died from pneumonia at age 9. Patients' mother died from "stomach cancer in the lining of her intestines" at age 41. The patient has 2 brothers. The patient has one first cousin with breast cancer. Patient denies anyone in her family having ovarian, prostate or pancreatic cancer.    GYNECOLOGIC HISTORY:  No LMP recorded. Patient is postmenopausal. 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 Before she was a Quarry manager, she worked at [employer]. Her husband, Jeannett Senior, is retired from Therapist, art.  The patient has 3 children: [name], and [name]. [name] is 20, lives in [place], and is retired from the [employer]. [name] is 16, lives in [place], [place], and is a Media planner. [name] is 42, lives in [place], [place], and drives long distances. Mikeisha has 5 grandchildren, 4 of them in their 100s, 1 (in [place]) 75 years old.  No great grandchildren. She attends [employer].   ADVANCED DIRECTIVES: In the absence of any documentation to the contrary, the patient's spouse is their HCPOA.    HEALTH MAINTENANCE: Social History   Tobacco Use   Smoking status: Never   Smokeless tobacco: Never  Vaping Use   Vaping Use: Never used  Substance Use Topics   Alcohol use: No   Drug use: No    Colonoscopy: March 2012, [facility]  PAP: 4+ years ago at [facility] density: 09/2020, -1.4  No Known Allergies  Current Outpatient Medications  Medication Sig Dispense Refill   alendronate (FOSAMAX)  70 MG tablet TAKE 1 TABLET EVERY MONDAY WITH A FULL GLASS OF WATER ON AN EMPTY STOMACH 12 tablet 0   anastrozole (ARIMIDEX) 1 MG tablet TAKE 1 TABLET DAILY 90 tablet 3   aspirin 81 MG tablet Take 81 mg by mouth daily.     Calcium Carb-Cholecalciferol (CALCIUM 600 + D PO) Take 1 tablet by mouth daily.     Cholecalciferol (VITAMIN D3) 2000 UNITS TABS Take 2,000 Units by mouth daily.      Coenzyme Q10 (CO Q 10 PO) Take 200 mg by mouth daily.      magnesium oxide (MAG-OX) 400 MG tablet Take 400 mg by mouth daily.     traMADol (ULTRAM) 50 MG tablet Take 1 tablet (50 mg total) by mouth every 6 (six) hours as needed for moderate pain. 30 tablet 0    vitamin B-12 (CYANOCOBALAMIN) 1000 MCG tablet Take 1,000 mcg by mouth daily.     No current facility-administered medications for this visit.     OBJECTIVE: African-American woman who appears well  There were no vitals filed for this visit.   There is no height or weight on file to calculate BMI.   Wt Readings from Last 3 Encounters:  07/04/20 148 lb 3.2 oz (67.2 kg)  07/05/19 147 lb 14.4 oz (67.1 kg)  09/23/18 147 lb 3.2 oz (66.8 kg)      ECOG FS:0 - Asymptomatic  Telemedicine visit 03/07/2021  LAB RESULTS:  CMP     Component Value Date/Time   NA 138 07/04/2020 0948   K 4.2 07/04/2020 0948   CL 111 07/04/2020 0948   CO2 21 (L) 07/04/2020 0948   GLUCOSE 84 07/04/2020 0948   BUN 10 07/04/2020 0948   CREATININE 0.91 07/04/2020 0948   CREATININE 0.93 07/26/2018 1459   CALCIUM 8.8 (L) 07/04/2020 0948   PROT 6.5 07/04/2020 0948   ALBUMIN 3.7 07/04/2020 0948   AST 18 07/04/2020 0948   AST 18 07/26/2018 1459   ALT 16 07/04/2020 0948   ALT 23 07/26/2018 1459   ALKPHOS 35 (L) 07/04/2020 0948   BILITOT 0.7 07/04/2020 0948   BILITOT 0.5 07/26/2018 1459   GFRNONAA >60 07/04/2020 0948   GFRNONAA >60 07/26/2018 1459   GFRAA >60 07/05/2019 1000   GFRAA >60 07/26/2018 1459    Lab Results  Component Value Date   WBC 4.8 07/04/2020   NEUTROABS 2.4 07/04/2020   HGB 12.0 07/04/2020   HCT 38.7 07/04/2020   MCV 83.9 07/04/2020   PLT 278 07/04/2020    No results found for: LABCA2  No components found for: [id number]  No results for input(s): INR in the last 168 hours.  No results found for: LABCA2  No results found for: TAV697  No results found for: XYI016  No results found for: PVV748  No results found for: CA2729  No components found for: HGQUANT  No results found for: CEA1 / No results found for: CEA1   No results found for: AFPTUMOR  No results found for: CHROMOGRNA  No results found for: TOTALPROTELP, ALBUMINELP, A1GS, A2GS, BETS, BETA2SER, GAMS, MSPIKE,  SPEI (this displays SPEP labs)  No results found for: KPAFRELGTCHN, LAMBDASER, KAPLAMBRATIO (kappa/lambda light chains)  No results found for: HGBA, HGBA2QUANT, HGBFQUANT, HGBSQUAN (Hemoglobinopathy evaluation)   No results found for: LDH  No results found for: IRON, TIBC, IRONPCTSAT (Iron and TIBC)  No results found for: FERRITIN  Urinalysis No results found for: COLORURINE, APPEARANCEUR, LABSPEC, PHURINE, GLUCOSEU, HGBUR, BILIRUBINUR, KETONESUR, PROTEINUR, UROBILINOGEN, NITRITE,  LEUKOCYTESUR   STUDIES:  No results found.   ELIGIBLE FOR AVAILABLE RESEARCH PROTOCOL: no   ASSESSMENT: 75 y.o. [place], [place] woman status post right breast central biopsy 06/21/2018 for clinical T1c N0, stage IA invasive ductal carcinoma, grade 2 or 3, estrogen and progesterone receptor positive, HER-2 not amplified, with an MIB-1 of 5%  (1) status post right lumpectomy 07/12/2018 for a pT1b pN0, stage IA invasive ductal carcinoma, grade 2, with close but negative margins.  There was evidence of perineural invasion.  A total of 2 sentinel lymph nodes were removed  (2) opted against adjuvant radiation  (3) anastrozole started 08/04/2018  (a) bone density 08/10/2018 showed a T score of -1.9  (b) on alendronate weekly  (c) repeat bone density 09/07/2020 improved with a T score of -1.4  PLAN: Daisa is now 2-1/2 years out from definitive surgery for her breast cancer with no evidence of disease recurrence.  This is very favorable.  She is tolerating anastrozole well and the plan is to continue that a total of 5 years.  Her osteopenia is improved on alendronate.  She is tolerating that medication also without any side effects so we are continuing it as well.  She will have her next mammogram in December she will see [place] again in February 2023.  She knows to call for any other issue that may develop before the next visit.   Krithik Mapel, Virgie Dad, MD  03/06/21 11:20 PM Medical Oncology and  Hematology [facility] [place], [place] [address] Tel. [phone]    Fax. [phone]    I, Wilburn Mylar, am acting as scribe for Dr. Virgie Dad. Christabell Loseke.  I, Lurline Del MD, have reviewed the above documentation for accuracy and completeness, and I agree with the above.   *Total Encounter Time as defined by the Centers for Medicare and Medicaid Services includes, in addition to the face-to-face time of a patient visit (documented in the note above) non-face-to-face time: obtaining and reviewing outside history, ordering and reviewing medications, tests or procedures, care coordination (communications with other health care professionals or caregivers) and documentation in the medical record.

## 2021-03-07 ENCOUNTER — Inpatient Hospital Stay: Payer: Medicare Other | Attending: Oncology | Admitting: Oncology

## 2021-03-07 DIAGNOSIS — M85859 Other specified disorders of bone density and structure, unspecified thigh: Secondary | ICD-10-CM | POA: Diagnosis not present

## 2021-03-07 DIAGNOSIS — Z170 Estrogen receptor positive status [ER+]: Secondary | ICD-10-CM

## 2021-03-07 DIAGNOSIS — C50111 Malignant neoplasm of central portion of right female breast: Secondary | ICD-10-CM | POA: Diagnosis not present

## 2021-03-07 DIAGNOSIS — M8580 Other specified disorders of bone density and structure, unspecified site: Secondary | ICD-10-CM | POA: Insufficient documentation

## 2021-03-07 DIAGNOSIS — Z1231 Encounter for screening mammogram for malignant neoplasm of breast: Secondary | ICD-10-CM

## 2021-03-07 MED ORDER — ALENDRONATE SODIUM 70 MG PO TABS: 70.0000 mg | ORAL_TABLET | ORAL | 4 refills | Status: AC

## 2021-03-07 MED ORDER — ANASTROZOLE 1 MG PO TABS: 1.0000 mg | ORAL_TABLET | Freq: Every day | ORAL | 4 refills | Status: DC

## 2021-03-11 ENCOUNTER — Telehealth: Payer: Self-pay | Admitting: Oncology

## 2021-03-11 NOTE — Telephone Encounter (Signed)
Scheduled appts per 8/4 los. Called pt, no answer. Left msg with appts date and times.

## 2021-03-15 ENCOUNTER — Other Ambulatory Visit: Payer: Self-pay | Admitting: *Deleted

## 2021-03-15 MED ORDER — ANASTROZOLE 1 MG PO TABS: 1.0000 mg | ORAL_TABLET | Freq: Every day | ORAL | 4 refills | Status: DC

## 2021-03-22 ENCOUNTER — Telehealth: Payer: Self-pay | Admitting: *Deleted

## 2021-03-22 NOTE — Telephone Encounter (Signed)
Pt left message today stating she is calling again about the prescription that went to the [facility] vs her mail order pharmacy [facility].  Note pt called last week- refill for the anastrozole resent to the [facility] on 03/15/2021 with noted confirmation as received.  This RN had called pt the same day and left message per above - this RN called again and obtained identified VM for the patient- message left informing to call if needed to this RN - and gave above information per refill need.

## 2021-03-30 DIAGNOSIS — U071 COVID-19: Secondary | ICD-10-CM | POA: Diagnosis not present

## 2021-04-04 DIAGNOSIS — E7800 Pure hypercholesterolemia, unspecified: Secondary | ICD-10-CM | POA: Diagnosis not present

## 2021-04-04 DIAGNOSIS — D649 Anemia, unspecified: Secondary | ICD-10-CM | POA: Diagnosis not present

## 2021-04-04 DIAGNOSIS — Z23 Encounter for immunization: Secondary | ICD-10-CM | POA: Diagnosis not present

## 2021-04-04 DIAGNOSIS — E559 Vitamin D deficiency, unspecified: Secondary | ICD-10-CM | POA: Diagnosis not present

## 2021-04-04 DIAGNOSIS — Z6826 Body mass index (BMI) 26.0-26.9, adult: Secondary | ICD-10-CM | POA: Diagnosis not present

## 2021-04-04 DIAGNOSIS — M810 Age-related osteoporosis without current pathological fracture: Secondary | ICD-10-CM | POA: Diagnosis not present

## 2021-05-18 DIAGNOSIS — Z23 Encounter for immunization: Secondary | ICD-10-CM | POA: Diagnosis not present

## 2021-05-22 DIAGNOSIS — H2513 Age-related nuclear cataract, bilateral: Secondary | ICD-10-CM | POA: Diagnosis not present

## 2021-07-02 DIAGNOSIS — H40003 Preglaucoma, unspecified, bilateral: Secondary | ICD-10-CM | POA: Diagnosis not present

## 2021-07-02 DIAGNOSIS — H2513 Age-related nuclear cataract, bilateral: Secondary | ICD-10-CM | POA: Diagnosis not present

## 2021-07-12 ENCOUNTER — Ambulatory Visit
Admission: RE | Admit: 2021-07-12 | Discharge: 2021-07-12 | Disposition: A | Discharge status: Home / Self Care | Payer: Medicare Other | Source: Ambulatory Visit | Attending: Oncology | Admitting: Oncology

## 2021-07-12 DIAGNOSIS — Z1231 Encounter for screening mammogram for malignant neoplasm of breast: Secondary | ICD-10-CM

## 2021-07-12 DIAGNOSIS — C50111 Malignant neoplasm of central portion of right female breast: Secondary | ICD-10-CM

## 2021-07-12 DIAGNOSIS — Z853 Personal history of malignant neoplasm of breast: Secondary | ICD-10-CM | POA: Diagnosis not present

## 2021-07-12 DIAGNOSIS — R922 Inconclusive mammogram: Secondary | ICD-10-CM | POA: Diagnosis not present

## 2021-08-13 ENCOUNTER — Other Ambulatory Visit: Payer: Self-pay | Admitting: Hematology and Oncology

## 2021-08-13 DIAGNOSIS — E7800 Pure hypercholesterolemia, unspecified: Secondary | ICD-10-CM | POA: Diagnosis not present

## 2021-08-13 DIAGNOSIS — Z6825 Body mass index (BMI) 25.0-25.9, adult: Secondary | ICD-10-CM | POA: Diagnosis not present

## 2021-08-13 DIAGNOSIS — N390 Urinary tract infection, site not specified: Secondary | ICD-10-CM | POA: Diagnosis not present

## 2021-08-13 DIAGNOSIS — Z853 Personal history of malignant neoplasm of breast: Secondary | ICD-10-CM

## 2021-08-26 DIAGNOSIS — Z6825 Body mass index (BMI) 25.0-25.9, adult: Secondary | ICD-10-CM | POA: Diagnosis not present

## 2021-08-26 DIAGNOSIS — R531 Weakness: Secondary | ICD-10-CM | POA: Diagnosis not present

## 2021-08-26 DIAGNOSIS — R5383 Other fatigue: Secondary | ICD-10-CM | POA: Diagnosis not present

## 2021-08-29 DIAGNOSIS — Z139 Encounter for screening, unspecified: Secondary | ICD-10-CM | POA: Diagnosis not present

## 2021-08-29 DIAGNOSIS — Z9181 History of falling: Secondary | ICD-10-CM | POA: Diagnosis not present

## 2021-08-29 DIAGNOSIS — Z1331 Encounter for screening for depression: Secondary | ICD-10-CM | POA: Diagnosis not present

## 2021-08-29 DIAGNOSIS — E785 Hyperlipidemia, unspecified: Secondary | ICD-10-CM | POA: Diagnosis not present

## 2021-08-29 DIAGNOSIS — Z0000 Encounter for general adult medical examination without abnormal findings: Secondary | ICD-10-CM | POA: Diagnosis not present

## 2021-09-30 ENCOUNTER — Other Ambulatory Visit: Payer: Self-pay | Admitting: *Deleted

## 2021-09-30 DIAGNOSIS — C50111 Malignant neoplasm of central portion of right female breast: Secondary | ICD-10-CM

## 2021-10-01 ENCOUNTER — Encounter: Payer: Self-pay | Admitting: Hematology and Oncology

## 2021-10-01 ENCOUNTER — Inpatient Hospital Stay (HOSPITAL_BASED_OUTPATIENT_CLINIC_OR_DEPARTMENT_OTHER): Payer: Medicare Other | Admitting: Hematology and Oncology

## 2021-10-01 ENCOUNTER — Other Ambulatory Visit: Payer: Self-pay

## 2021-10-01 ENCOUNTER — Inpatient Hospital Stay: Payer: Medicare Other | Attending: Hematology and Oncology

## 2021-10-01 VITALS — BP 103/52 | HR 69 | Temp 97.7°F | Resp 18 | Wt 145.6 lb

## 2021-10-01 DIAGNOSIS — Z170 Estrogen receptor positive status [ER+]: Secondary | ICD-10-CM | POA: Insufficient documentation

## 2021-10-01 DIAGNOSIS — Z79811 Long term (current) use of aromatase inhibitors: Secondary | ICD-10-CM | POA: Insufficient documentation

## 2021-10-01 DIAGNOSIS — Z79899 Other long term (current) drug therapy: Secondary | ICD-10-CM | POA: Diagnosis not present

## 2021-10-01 DIAGNOSIS — M85859 Other specified disorders of bone density and structure, unspecified thigh: Secondary | ICD-10-CM

## 2021-10-01 DIAGNOSIS — Z7982 Long term (current) use of aspirin: Secondary | ICD-10-CM | POA: Diagnosis not present

## 2021-10-01 DIAGNOSIS — C50111 Malignant neoplasm of central portion of right female breast: Secondary | ICD-10-CM

## 2021-10-01 DIAGNOSIS — C50811 Malignant neoplasm of overlapping sites of right female breast: Secondary | ICD-10-CM | POA: Diagnosis not present

## 2021-10-01 LAB — CMP (CANCER CENTER ONLY)
ALT: 15 U/L (ref 0–44)
AST: 19 U/L (ref 15–41)
Albumin: 4 g/dL (ref 3.5–5.0)
Alkaline Phosphatase: 41 U/L (ref 38–126)
Anion gap: 4 — ABNORMAL LOW (ref 5–15)
BUN: 12 mg/dL (ref 8–23)
CO2: 26 mmol/L (ref 22–32)
Calcium: 9.2 mg/dL (ref 8.9–10.3)
Chloride: 109 mmol/L (ref 98–111)
Creatinine: 0.85 mg/dL (ref 0.44–1.00)
GFR, Estimated: >60 mL/min (ref >60)
Glucose, Bld: 115 mg/dL — ABNORMAL HIGH (ref 70–99)
Potassium: 4 mmol/L (ref 3.5–5.1)
Sodium: 139 mmol/L (ref 135–145)
Total Bilirubin: 0.5 mg/dL (ref 0.3–1.2)
Total Protein: 6.6 g/dL (ref 6.5–8.1)

## 2021-10-01 LAB — CBC WITH DIFFERENTIAL (CANCER CENTER ONLY)
Abs Immature Granulocytes: 0.01 10*3/uL (ref 0.00–0.07)
Basophils Absolute: 0.1 10*3/uL (ref 0.0–0.1)
Basophils Relative: 1 %
Eosinophils Absolute: 0.2 10*3/uL (ref 0.0–0.5)
Eosinophils Relative: 4 %
HCT: 37.1 % (ref 36.0–46.0)
Hemoglobin: 11.6 g/dL — ABNORMAL LOW (ref 12.0–15.0)
Immature Granulocytes: 0 %
Lymphocytes Relative: 39 %
Lymphs Abs: 2.3 10*3/uL (ref 0.7–4.0)
MCH: 26 pg (ref 26.0–34.0)
MCHC: 31.3 g/dL (ref 30.0–36.0)
MCV: 83 fL (ref 80.0–100.0)
Monocytes Absolute: 0.3 10*3/uL (ref 0.1–1.0)
Monocytes Relative: 6 %
Neutro Abs: 2.9 10*3/uL (ref 1.7–7.7)
Neutrophils Relative %: 50 %
Platelet Count: 261 10*3/uL (ref 150–400)
RBC: 4.47 MIL/uL (ref 3.87–5.11)
RDW: 13.8 % (ref 11.5–15.5)
WBC Count: 5.8 10*3/uL (ref 4.0–10.5)
nRBC: 0 % (ref 0.0–0.2)

## 2021-10-01 NOTE — Progress Notes (Signed)
[place]  Telephone:(336) [phone] Fax:(336) [phone]   ID: Emily Bell DOB: Feb 06, 1946  MR#: [id number]  WHQ#:[id number]  Patient Care Team: Cyndi Bender, Hershal Coria as PCP - General (Physician Assistant) Magrinat, Virgie Dad, MD (Inactive) as Consulting Physician (Oncology) Coralie Keens, MD as Consulting Physician (General Surgery) Eppie Gibson, MD as Attending Physician (Radiation Oncology) Richmond Campbell, MD as Consulting Physician (Gastroenterology) OTHER MD:   CHIEF COMPLAINT: Estrogen receptor positive breast cancer  CURRENT TREATMENT: Anastrozole   INTERVAL HISTORY:  Emily Bell was contacted today for follow up of her estrogen receptor positive breast cancer. She continues on anastrozole.  She has some intermittent hot flashes and achiness which is tolerable. No other complaints today. She denies any breast changes Last mammogram Dec 2022, no concerns. She also underwent bone density screening on 09/07/2020 showing a T-score of -1.4, which is considered osteopenic.   She continues on Vit D supplementation and fosamax. Rest of the pertinent 10 point ROS reviewed and negative.  REVIEW OF SYSTEMS:A detailed review of systems today was otherwise noncontributory   COVID 19 VACCINATION STATUS: Status post Moderna x2 followed by booster x2   HISTORY OF CURRENT ILLNESS: From the original intake note:  Emily Bell presented with focal pain in the retroareolar right breast as well as in the inferior left breast near the inframammary fold. She originally had a routine screening in 08/2018, but called and had her mammography pushed up. She underwent bilateral diagnostic mammography with tomography and bilateral breast ultrasonography at [facility] on 06/17/2018 showing: Breast Density Category B. In the lower slightly inner quadrant of the right breast there is an irregular mass which on the CC view appears to be associated with some distortion. The mass  measures approximately 1 cm.  On physical exam, no discrete palpable masses are identified in the medial right breast. Ultrasound of the right breast at 3 o'clock, 1 cm from the nipple demonstrates an irregular hypoechoic mass measuring 1.1 x 0.5 x 0.7 cm. Ultrasound of the right axilla demonstrates multiple normal-appearing lymph nodes.   There are no suspicious findings seen at the site of focal pain in the anterior left breast. Ultrasound of the left breast at the site of pain demonstrates normal fibroglandular tissue. No masses or suspicious areas of shadowing are identified.   Accordingly on 06/21/2018 she proceeded to biopsy of the right breast area in question. The pathology from this procedure showed ([id number]): invasive mammary carcinoma, Grade II - III, and a mammary carcinoma in situ, intermediate nuclear grade. E-cadherin is positive, consistent with a ductal phenotype. Prognostic indicators significant for: estrogen receptor, 90% positive and progesterone receptor, 90% positive, both with strong staining intensity. Proliferation marker Ki67 at 5%. HER2 negative by immunohistochemistry (1+).   On 07/12/2018 she underwent a right breast partial mastectomy. Pathology from this procedure ([id number]) shows an invasive ductal carcinoma, grade II/III, spanning 1.1 cm, and a ductal carcinoma in situ, low grade. Perineural invasion is identified. Ductal carcinoma in situ is focally 0.1 cm to the anterior margin. Invasive ductal carcinoma is focally less than 0.1 cm to the medial margin. Both sentinel right axillary lymph nodes biopsied were clear.  The patient's subsequent history is as detailed below.   PAST MEDICAL HISTORY: Past Medical History:  Diagnosis Date   Anemia    Cancer ([place])    right breast   Colon polyps    Hypercholesteremia    Osteoporosis    Vitamin B 12 deficiency    Vitamin D deficiency  PAST SURGICAL HISTORY: Past Surgical History:  Procedure Laterality Date    BREAST LUMPECTOMY Right 07/12/2018   BREAST LUMPECTOMY WITH RADIOACTIVE SEED AND SENTINEL LYMPH NODE BIOPSY Right 07/12/2018   Procedure: RIGHT BREAST PARTIAL MASTECTOMY WITH RADIOACTIVE SEED AND SENTINEL LYMPH NODE BIOPSY;  Surgeon: Coralie Keens, MD;  Location: [facility];  Service: General;  Laterality: Right;   COLONOSCOPY     neg hx      FAMILY HISTORY: Family History  Problem Relation Age of Onset   Hypertension Mother    Stomach cancer Mother    Cancer Cousin        Breast   Colon cancer Neg Hx   Emily Bell's father died from pneumonia at age 53. Patients' mother died from "stomach cancer in the lining of her intestines" at age 22. The patient has 2 brothers. The patient has one first cousin with breast cancer. Patient denies anyone in her family having ovarian, prostate or pancreatic cancer.    GYNECOLOGIC HISTORY:  No LMP recorded. Patient is postmenopausal. 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 Her husband, Jeannett Senior, is retired from Therapist, art.  The patient has 3 children: Emily Bell, and Emily Bell. Emily Bell is 70, lives in [place], and is retired from the [employer]. Emily Bell is 54, lives in [place], [place], and is a Media planner. Emily Bell is 36, lives in [place], [place], and drives long distances. Emily Bell has 5 grandchildren, 4 of them in their 30s, 1 (in [place]) 76 years old.  No great grandchildren. She attends [employer].   ADVANCED DIRECTIVES: In the absence of any documentation to the contrary, the patient's spouse is their HCPOA.    HEALTH MAINTENANCE: Social History   Tobacco Use   Smoking status: Never   Smokeless tobacco: Never  Vaping Use   Vaping Use: Never used  Substance Use Topics    Alcohol use: No   Drug use: No    Colonoscopy: March 2012, [facility]  PAP: 4+ years ago at [facility] density: 09/2020, -1.4  No Known Allergies  Current Outpatient Medications  Medication Sig Dispense Refill   alendronate (FOSAMAX) 70 MG tablet Take 1 tablet (70 mg total) by mouth once a week. Take with a full glass of water on an empty stomach. 12 tablet 4   anastrozole (ARIMIDEX) 1 MG tablet Take 1 tablet (1 mg total) by mouth daily. 90 tablet 4   aspirin 81 MG tablet Take 81 mg by mouth daily.     Calcium Carb-Cholecalciferol (CALCIUM 600 + D PO) Take 1 tablet by mouth daily.     Cholecalciferol (VITAMIN D3) 2000 UNITS TABS Take 2,000 Units by mouth daily.      Coenzyme Q10 (CO Q 10 PO) Take 200 mg by mouth daily.      magnesium oxide (MAG-OX) 400 MG tablet Take 400 mg by mouth daily.     traMADol (ULTRAM) 50 MG tablet Take 1 tablet (50 mg total) by mouth every 6 (six) hours as needed for moderate pain. 30 tablet 0   vitamin B-12 (CYANOCOBALAMIN) 1000 MCG tablet Take 1,000 mcg by mouth daily.     No current facility-administered medications for this visit.  OBJECTIVE: African-American woman who appears well  Vitals:   10/01/21 1445  BP: (!) 103/52  Pulse: 69  Resp: 18  Temp: 97.7 F (36.5 C)  SpO2: 99%     Body mass index is 24.23 kg/m.   Wt Readings from Last 3 Encounters:  10/01/21 145 lb 9.6 oz (66 kg)  07/04/20 148 lb 3.2 oz (67.2 kg)  07/05/19 147 lb 14.4 oz (67.1 kg)      ECOG FS:0 - Asymptomatic   Physical Exam Constitutional:      Appearance: Normal appearance.  Cardiovascular:     Rate and Rhythm: Normal rate and regular rhythm.  Pulmonary:     Effort: Pulmonary effort is normal.     Breath sounds: Normal breath sounds.  Chest:     Comments: Bilateral breasts inspected. No palpable masses or regional adenopathy Musculoskeletal:        General: No swelling.     Cervical back: No rigidity.  Lymphadenopathy:     Cervical: No cervical  adenopathy.  Skin:    General: Skin is warm and dry.  Neurological:     General: No focal deficit present.     Mental Status: She is alert.   LAB RESULTS:  CMP     Component Value Date/Time   NA 138 07/04/2020 0948   K 4.2 07/04/2020 0948   CL 111 07/04/2020 0948   CO2 21 (L) 07/04/2020 0948   GLUCOSE 84 07/04/2020 0948   BUN 10 07/04/2020 0948   CREATININE 0.91 07/04/2020 0948   CREATININE 0.93 07/26/2018 1459   CALCIUM 8.8 (L) 07/04/2020 0948   PROT 6.5 07/04/2020 0948   ALBUMIN 3.7 07/04/2020 0948   AST 18 07/04/2020 0948   AST 18 07/26/2018 1459   ALT 16 07/04/2020 0948   ALT 23 07/26/2018 1459   ALKPHOS 35 (L) 07/04/2020 0948   BILITOT 0.7 07/04/2020 0948   BILITOT 0.5 07/26/2018 1459   GFRNONAA >60 07/04/2020 0948   GFRNONAA >60 07/26/2018 1459   GFRAA >60 07/05/2019 1000   GFRAA >60 07/26/2018 1459    Lab Results  Component Value Date   WBC 5.8 10/01/2021   NEUTROABS 2.9 10/01/2021   HGB 11.6 (L) 10/01/2021   HCT 37.1 10/01/2021   MCV 83.0 10/01/2021   PLT 261 10/01/2021    No results found for: LABCA2  No components found for: [id number]  No results for input(s): INR in the last 168 hours.  No results found for: LABCA2  No results found for: CLE751  No results found for: ZGY174  No results found for: BSW967  No results found for: CA2729  No components found for: HGQUANT  No results found for: CEA1 / No results found for: CEA1   No results found for: AFPTUMOR  No results found for: CHROMOGRNA  No results found for: TOTALPROTELP, ALBUMINELP, A1GS, A2GS, BETS, BETA2SER, GAMS, MSPIKE, SPEI (this displays SPEP labs)  No results found for: KPAFRELGTCHN, LAMBDASER, KAPLAMBRATIO (kappa/lambda light chains)  No results found for: HGBA, HGBA2QUANT, HGBFQUANT, HGBSQUAN (Hemoglobinopathy evaluation)   No results found for: LDH  No results found for: IRON, TIBC, IRONPCTSAT (Iron and TIBC)  No results found for: FERRITIN  Urinalysis No  results found for: COLORURINE, APPEARANCEUR, LABSPEC, PHURINE, GLUCOSEU, HGBUR, BILIRUBINUR, KETONESUR, PROTEINUR, UROBILINOGEN, NITRITE, LEUKOCYTESUR   STUDIES:  No results found.   ELIGIBLE FOR AVAILABLE RESEARCH PROTOCOL: no  ASSESSMENT: 76 y.o. [place], [place] woman status post right breast central biopsy 06/21/2018 for clinical T1c N0, stage IA invasive  ductal carcinoma, grade 2 or 3, estrogen and progesterone receptor positive, HER-2 not amplified, with an MIB-1 of 5%  (1) status post right lumpectomy 07/12/2018 for a pT1b pN0, stage IA invasive ductal carcinoma, grade 2, with close but negative margins.  There was evidence of perineural invasion.  A total of 2 sentinel lymph nodes were removed  (2) opted against adjuvant radiation  (3) anastrozole started 08/04/2018  (a) bone density 08/10/2018 showed a T score of -1.9  (b) on alendronate weekly  (c) repeat bone density 09/07/2020 improved with a T score of -1.4  PLAN: Ms. Siefken is here for follow-up on anastrozole.  She has been doing very well with her current medication.  She denies any intolerable side effects.  No concerning findings on review of systems or physical examination. Last mammogram in December without any evidence for malignancy. With regards to her bone health, she continues on Fosamax weekly, last bone density in February showed osteopenia which has been improving.  She also continues on vitamin D supplementation.  Next bone density due in February 2024. Next mammogram due in December 2023. Total time spent: 30 minutes *Total Encounter Time as defined by the Centers for Medicare and Medicaid Services includes, in addition to the face-to-face time of a patient visit (documented in the note above) non-face-to-face time: obtaining and reviewing outside history, ordering and reviewing medications, tests or procedures, care coordination (communications with other health care professionals or caregivers) and  documentation in the medical record.

## 2022-02-03 DIAGNOSIS — H269 Unspecified cataract: Secondary | ICD-10-CM | POA: Diagnosis not present

## 2022-02-03 DIAGNOSIS — E7800 Pure hypercholesterolemia, unspecified: Secondary | ICD-10-CM | POA: Diagnosis not present

## 2022-02-03 DIAGNOSIS — Z01818 Encounter for other preprocedural examination: Secondary | ICD-10-CM | POA: Diagnosis not present

## 2022-02-03 DIAGNOSIS — C50919 Malignant neoplasm of unspecified site of unspecified female breast: Secondary | ICD-10-CM | POA: Diagnosis not present

## 2022-02-24 DIAGNOSIS — Z79811 Long term (current) use of aromatase inhibitors: Secondary | ICD-10-CM | POA: Diagnosis not present

## 2022-02-24 DIAGNOSIS — Z7982 Long term (current) use of aspirin: Secondary | ICD-10-CM | POA: Diagnosis not present

## 2022-02-24 DIAGNOSIS — Z79899 Other long term (current) drug therapy: Secondary | ICD-10-CM | POA: Diagnosis not present

## 2022-02-24 DIAGNOSIS — H2512 Age-related nuclear cataract, left eye: Secondary | ICD-10-CM | POA: Diagnosis not present

## 2022-03-31 ENCOUNTER — Other Ambulatory Visit: Payer: Self-pay

## 2022-03-31 ENCOUNTER — Inpatient Hospital Stay: Payer: Medicare Other | Attending: Hematology and Oncology | Admitting: Hematology and Oncology

## 2022-03-31 ENCOUNTER — Encounter: Payer: Self-pay | Admitting: Hematology and Oncology

## 2022-03-31 VITALS — BP 121/73 | HR 56 | Temp 97.5°F | Resp 18 | Ht 65.0 in | Wt 142.4 lb

## 2022-03-31 DIAGNOSIS — Z79811 Long term (current) use of aromatase inhibitors: Secondary | ICD-10-CM | POA: Insufficient documentation

## 2022-03-31 DIAGNOSIS — C50111 Malignant neoplasm of central portion of right female breast: Secondary | ICD-10-CM | POA: Insufficient documentation

## 2022-03-31 DIAGNOSIS — M85859 Other specified disorders of bone density and structure, unspecified thigh: Secondary | ICD-10-CM | POA: Diagnosis not present

## 2022-03-31 DIAGNOSIS — Z170 Estrogen receptor positive status [ER+]: Secondary | ICD-10-CM | POA: Diagnosis not present

## 2022-03-31 NOTE — Progress Notes (Signed)
[place]  Telephone:(336) [phone] Fax:(336) [phone]   ID: Emily Bell DOB: 15-Jun-1946  MR#: [id number]  LZJ#:[id number]  Patient Care Team: Cyndi Bender, Hershal Coria as PCP - General (Physician Assistant) Magrinat, Virgie Dad, MD (Inactive) as Consulting Physician (Oncology) Coralie Keens, MD as Consulting Physician (General Surgery) Eppie Gibson, MD as Attending Physician (Radiation Oncology) Richmond Campbell, MD as Consulting Physician (Gastroenterology) OTHER MD:   CHIEF COMPLAINT: Estrogen receptor positive breast cancer  CURRENT TREATMENT: Anastrozole   INTERVAL HISTORY:  Emily Bell is here for a follow up on anastrozole. She reports some emotional lability, disinterest in things that she used to enjoy since she has started anastrozole but is reluctant to switch to a different form of antiestrogen therapy.  She will be completing 5 years of antiestrogen therapy on August 05, 2023. Last mammogram Dec 2022, no concerns.  She would be due for another mammogram in December 2023.  She denies any new breast changes. She also underwent bone density screening on 09/07/2020 showing a T-score of -1.4, which is considered osteopenic.   She continues on Vit D supplementation and fosamax.  She also enjoys gardening and yard work and tries to stay active. Rest of the pertinent 10 point ROS reviewed and negative.  REVIEW OF SYSTEMS:A detailed review of systems today was otherwise noncontributory   COVID 19 VACCINATION STATUS: Status post Moderna x2 followed by booster x2   HISTORY OF CURRENT ILLNESS: From the original intake note:  Emily Bell presented with focal pain in the retroareolar right breast as well as in the inferior left breast near the inframammary fold. She originally had a routine screening in 08/2018, but called and had her mammography pushed up. She underwent bilateral diagnostic mammography with tomography and bilateral breast ultrasonography at [facility] on 06[facility]17[facility]2018 showing: Breast Density Category B. In the lower slightly inner quadrant of the right breast there is an irregular mass which on the CC view appears to be associated with some distortion. The mass measures approximately 1 cm.  On physical exam, no discrete palpable masses are identified in the medial right breast. Ultrasound of the right breast at 3 o'clock, 1 cm from the nipple demonstrates an irregular hypoechoic mass measuring 1.1 x 0.5 x 0.7 cm. Ultrasound of the right axilla demonstrates multiple normal-appearing lymph nodes.   There are no suspicious findings seen at the site of focal pain in the anterior left breast. Ultrasound of the left breast at the site of pain demonstrates normal fibroglandular tissue. No masses or suspicious areas of shadowing are identified.   Accordingly on 06/21/2018 she proceeded to biopsy of the right breast area in question. The pathology from this procedure showed ([id number]): invasive mammary carcinoma, Grade II - III, and a mammary carcinoma in situ, intermediate nuclear grade. E-cadherin is positive, consistent with a ductal phenotype. Prognostic indicators significant for: estrogen receptor, 90% positive and progesterone receptor, 90% positive, both with strong staining intensity. Proliferation marker Ki67 at 5%. HER2 negative by immunohistochemistry (1+).   On 07/12/2018 she underwent a right breast partial mastectomy. Pathology from this procedure ([id number]) shows an invasive ductal carcinoma, grade II/III, spanning 1.1 cm, and a ductal carcinoma in situ, low grade. Perineural invasion is identified. Ductal carcinoma in situ is focally 0.1 cm to the anterior margin. Invasive ductal carcinoma is focally less than 0.1 cm to the medial margin. Both sentinel right axillary lymph nodes biopsied were clear.  The patient's subsequent history is as detailed below.  PAST MEDICAL HISTORY: Past Medical History:  Diagnosis Date   Anemia     Cancer ([place])    right breast   Colon polyps    Hypercholesteremia    Osteoporosis    Vitamin B 12 deficiency    Vitamin D deficiency     PAST SURGICAL HISTORY: Past Surgical History:  Procedure Laterality Date   BREAST LUMPECTOMY Right 07/12/2018   BREAST LUMPECTOMY WITH RADIOACTIVE SEED AND SENTINEL LYMPH NODE BIOPSY Right 07/12/2018   Procedure: RIGHT BREAST PARTIAL MASTECTOMY WITH RADIOACTIVE SEED AND SENTINEL LYMPH NODE BIOPSY;  Surgeon: Coralie Keens, MD;  Location: [facility];  Service: General;  Laterality: Right;   COLONOSCOPY     neg hx      FAMILY HISTORY: Family History  Problem Relation Age of Onset   Hypertension Bell    Stomach cancer Bell    Cancer Cousin        Breast   Colon cancer Neg Hx   Emily Bell died from pneumonia at age 89. Emily Bell died from "stomach cancer in the lining of her intestines" at age 14. The patient has 2 brothers. The patient has one first cousin with breast cancer. Patient denies anyone in her family having ovarian, prostate or pancreatic cancer.    GYNECOLOGIC HISTORY:  No LMP recorded. Patient is postmenopausal. 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 Her husband, Emily Bell, is retired from Therapist, art.  The patient has 3 children: Emily Bell, and Emily Bell. Emily Bell is 79, lives in [place], and is retired from the [employer]. Emily Bell is 33, lives in [place], [place], and is a Media planner. Emily Bell is 30, lives in [place], [place], and drives long distances. Emily Bell has 5 grandchildren, 4 of them in their 64s, 1 (in [place]) 76 years old.  No great grandchildren. She attends [employer].   ADVANCED DIRECTIVES: In the absence of any documentation to the  contrary, the patient's spouse is their HCPOA.    HEALTH MAINTENANCE: Social History   Tobacco Use   Smoking status: Never   Smokeless tobacco: Never  Vaping Use   Vaping Use: Never used  Substance Use Topics   Alcohol use: No   Drug use: No    Colonoscopy: March 2012, [facility]  PAP: 4+ years ago at [facility] density: 09/2020, -1.4  No Known Allergies  Current Outpatient Medications  Medication Sig Dispense Refill   alendronate (FOSAMAX) 70 MG tablet Take 1 tablet (70 mg total) by mouth once a week. Take with a full glass of water on an empty stomach. 12 tablet 4   anastrozole (ARIMIDEX) 1 MG tablet Take 1 tablet (1 mg total) by mouth daily. 90 tablet 4   aspirin 81 MG tablet Take 81 mg by mouth daily.     Calcium Carb-Cholecalciferol (CALCIUM 600 + D PO) Take 1 tablet by mouth daily.     Cholecalciferol (VITAMIN D3) 2000 UNITS TABS Take 2,000 Units by mouth daily.      Coenzyme Q10 (CO Q 10 PO) Take 200 mg by mouth daily.      magnesium oxide (MAG-OX) 400 MG tablet Take 400 mg by mouth daily.     rosuvastatin (CRESTOR) 5  MG tablet Take 5 mg by mouth daily.     vitamin B-12 (CYANOCOBALAMIN) 1000 MCG tablet Take 1,000 mcg by mouth daily.     No current facility-administered medications for this visit.     OBJECTIVE: African-American woman who appears well  There were no vitals filed for this visit.    There is no height or weight on file to calculate BMI.   Wt Readings from Last 3 Encounters:  10/01/21 145 lb 9.6 oz (66 kg)  07/04/20 148 lb 3.2 oz (67.2 kg)  07/05/19 147 lb 14.4 oz (67.1 kg)      ECOG FS:0 - Asymptomatic   Physical Exam Constitutional:      Appearance: Normal appearance.  Cardiovascular:     Rate and Rhythm: Normal rate and regular rhythm.  Pulmonary:     Effort: Pulmonary effort is normal.     Breath sounds: Normal breath sounds.  Chest:     Comments: Bilateral breasts inspected. No palpable masses or regional adenopathy. Small  postop seroma in the right breast lumpectomy site.  No other palpable changes Musculoskeletal:        General: No swelling.     Cervical back: No rigidity.  Lymphadenopathy:     Cervical: No cervical adenopathy.  Skin:    General: Skin is warm and dry.  Neurological:     General: No focal deficit present.     Mental Status: She is alert.    LAB RESULTS:  CMP     Component Value Date/Time   NA 139 10/01/2021 1436   K 4.0 10/01/2021 1436   CL 109 10/01/2021 1436   CO2 26 10/01/2021 1436   GLUCOSE 115 (H) 10/01/2021 1436   BUN 12 10/01/2021 1436   CREATININE 0.85 10/01/2021 1436   CALCIUM 9.2 10/01/2021 1436   PROT 6.6 10/01/2021 1436   ALBUMIN 4.0 10/01/2021 1436   AST 19 10/01/2021 1436   ALT 15 10/01/2021 1436   ALKPHOS 41 10/01/2021 1436   BILITOT 0.5 10/01/2021 1436   GFRNONAA >60 10/01/2021 1436   GFRAA >60 07/05/2019 1000   GFRAA >60 07/26/2018 1459    Lab Results  Component Value Date   WBC 5.8 10/01/2021   NEUTROABS 2.9 10/01/2021   HGB 11.6 (L) 10/01/2021   HCT 37.1 10/01/2021   MCV 83.0 10/01/2021   PLT 261 10/01/2021    No results found for: "LABCA2"  No components found for: "ZOXWRU045"  No results for input(s): "INR" in the last 168 hours.  No results found for: "LABCA2"  No results found for: "WUJ811"  No results found for: "CAN125"  No results found for: "CAN153"  No results found for: "CA2729"  No components found for: "HGQUANT"  No results found for: "CEA1", "CEA" / No results found for: "CEA1", "CEA"   No results found for: "AFPTUMOR"  No results found for: "CHROMOGRNA"  No results found for: "TOTALPROTELP", "ALBUMINELP", "A1GS", "A2GS", "BETS", "BETA2SER", "GAMS", "MSPIKE", "SPEI" (this displays SPEP labs)  No results found for: "KPAFRELGTCHN", "LAMBDASER", "KAPLAMBRATIO" (kappa/lambda light chains)  No results found for: "HGBA", "HGBA2QUANT", "HGBFQUANT", "HGBSQUAN" (Hemoglobinopathy evaluation)   No results found for:  "LDH"  No results found for: "IRON", "TIBC", "IRONPCTSAT" (Iron and TIBC)  No results found for: "FERRITIN"  Urinalysis No results found for: "COLORURINE", "APPEARANCEUR", "LABSPEC", "PHURINE", "GLUCOSEU", "HGBUR", "BILIRUBINUR", "KETONESUR", "PROTEINUR", "UROBILINOGEN", "NITRITE", "LEUKOCYTESUR"   STUDIES:  No results found.   ELIGIBLE FOR AVAILABLE RESEARCH PROTOCOL: no  ASSESSMENT: 76 y.o. [place], [place] woman status post  right breast central biopsy 06/21/2018 for clinical T1c N0, stage IA invasive ductal carcinoma, grade 2 or 3, estrogen and progesterone receptor positive, HER-2 not amplified, with an MIB-1 of 5%  (1) status post right lumpectomy 07/12/2018 for a pT1b pN0, stage IA invasive ductal carcinoma, grade 2, with close but negative margins.  There was evidence of perineural invasion.  A total of 2 sentinel lymph nodes were removed  (2) opted against adjuvant radiation  (3) anastrozole started 08/04/2018  (a) bone density 08/10/2018 showed a T score of -1.9  (b) on alendronate weekly  (c) repeat bone density 09/07/2020 improved with a T score of -1.4  PLAN:  Emily. Seltzer is here for follow-up on anastrozole.   She has been doing very well with her current medication.   She denies any intolerable side effects except for some emotional lability.  We have discussed about antidepressants versus trying a different form of antiestrogen therapy but she is reluctant at this time.  She denies any other side effects such as hot flashes, arthralgias or vaginal dryness. Physical examination today unremarkable, no masses or regional adenopathy. She will complete anastrozole August 05, 2023. We have discussed about continuing to stay active, take vitamin D and calcium as tolerated. Return to clinic in 1 year or sooner as needed  Total time spent: 30 minutes  *Total Encounter Time as defined by the Centers for Medicare and Medicaid Services includes, in addition to the face-to-face  time of a patient visit (documented in the note above) non-face-to-face time: obtaining and reviewing outside history, ordering and reviewing medications, tests or procedures, care coordination (communications with other health care professionals or caregivers) and documentation in the medical record.

## 2022-04-04 DIAGNOSIS — Z862 Personal history of diseases of the blood and blood-forming organs and certain disorders involving the immune mechanism: Secondary | ICD-10-CM | POA: Diagnosis not present

## 2022-04-04 DIAGNOSIS — K5900 Constipation, unspecified: Secondary | ICD-10-CM | POA: Diagnosis not present

## 2022-04-04 DIAGNOSIS — E7800 Pure hypercholesterolemia, unspecified: Secondary | ICD-10-CM | POA: Diagnosis not present

## 2022-04-04 DIAGNOSIS — M810 Age-related osteoporosis without current pathological fracture: Secondary | ICD-10-CM | POA: Diagnosis not present

## 2022-04-04 DIAGNOSIS — E559 Vitamin D deficiency, unspecified: Secondary | ICD-10-CM | POA: Diagnosis not present

## 2022-04-04 DIAGNOSIS — H269 Unspecified cataract: Secondary | ICD-10-CM | POA: Diagnosis not present

## 2022-04-04 DIAGNOSIS — D649 Anemia, unspecified: Secondary | ICD-10-CM | POA: Diagnosis not present

## 2022-04-15 ENCOUNTER — Other Ambulatory Visit: Payer: Self-pay | Admitting: *Deleted

## 2022-04-15 MED ORDER — ANASTROZOLE 1 MG PO TABS: 1.0000 mg | ORAL_TABLET | Freq: Every day | ORAL | 4 refills | Status: DC

## 2022-04-28 DIAGNOSIS — Z79899 Other long term (current) drug therapy: Secondary | ICD-10-CM | POA: Diagnosis not present

## 2022-04-28 DIAGNOSIS — Z7983 Long term (current) use of bisphosphonates: Secondary | ICD-10-CM | POA: Diagnosis not present

## 2022-04-28 DIAGNOSIS — H2511 Age-related nuclear cataract, right eye: Secondary | ICD-10-CM | POA: Diagnosis not present

## 2022-04-28 DIAGNOSIS — Z7982 Long term (current) use of aspirin: Secondary | ICD-10-CM | POA: Diagnosis not present

## 2022-04-28 DIAGNOSIS — Z79811 Long term (current) use of aromatase inhibitors: Secondary | ICD-10-CM | POA: Diagnosis not present

## 2022-04-28 DIAGNOSIS — D649 Anemia, unspecified: Secondary | ICD-10-CM | POA: Diagnosis not present

## 2022-04-28 DIAGNOSIS — M810 Age-related osteoporosis without current pathological fracture: Secondary | ICD-10-CM | POA: Diagnosis not present

## 2022-06-06 DIAGNOSIS — Z23 Encounter for immunization: Secondary | ICD-10-CM | POA: Diagnosis not present

## 2022-06-18 DIAGNOSIS — Z23 Encounter for immunization: Secondary | ICD-10-CM | POA: Diagnosis not present

## 2022-07-14 ENCOUNTER — Ambulatory Visit
Admission: RE | Admit: 2022-07-14 | Discharge: 2022-07-14 | Disposition: A | Discharge status: Home / Self Care | Payer: Medicare Other | Source: Ambulatory Visit | Attending: Hematology and Oncology | Admitting: Hematology and Oncology

## 2022-07-14 DIAGNOSIS — Z853 Personal history of malignant neoplasm of breast: Secondary | ICD-10-CM | POA: Diagnosis not present

## 2022-07-14 DIAGNOSIS — R928 Other abnormal and inconclusive findings on diagnostic imaging of breast: Secondary | ICD-10-CM | POA: Diagnosis not present

## 2022-09-04 DIAGNOSIS — N390 Urinary tract infection, site not specified: Secondary | ICD-10-CM | POA: Diagnosis not present

## 2022-12-04 IMAGING — MG DIGITAL DIAGNOSTIC BILAT W/ TOMO W/ CAD
9 series · 9 of 25 positions shown · non-contrast
Comparison: Previous exam(s).

CLINICAL DATA: Status post right lumpectomy for breast cancer in
7276.

EXAM:
DIGITAL DIAGNOSTIC BILATERAL MAMMOGRAM WITH TOMO AND CAD

[R MLO]
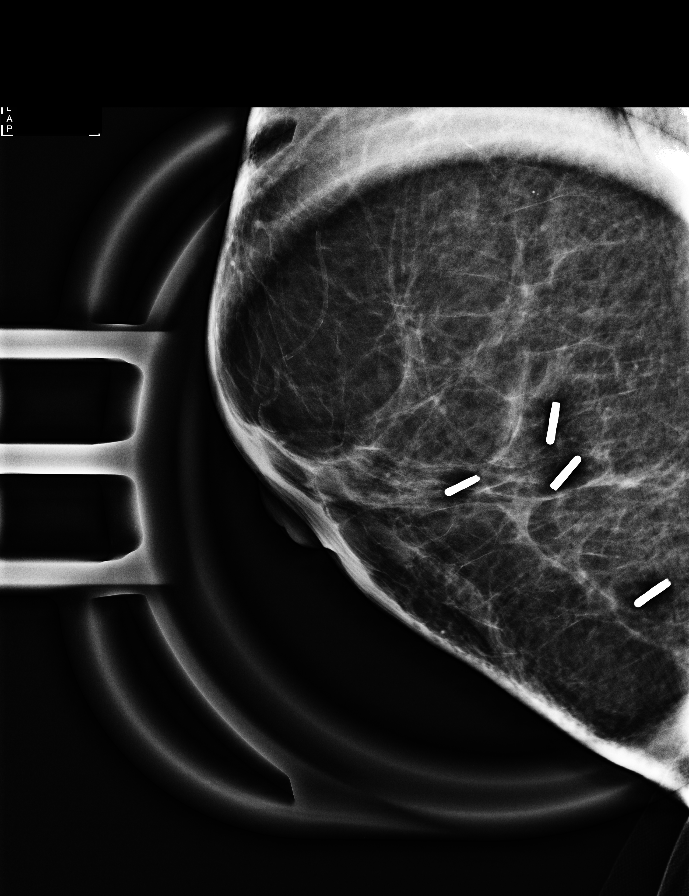

[R CC synth-2D]
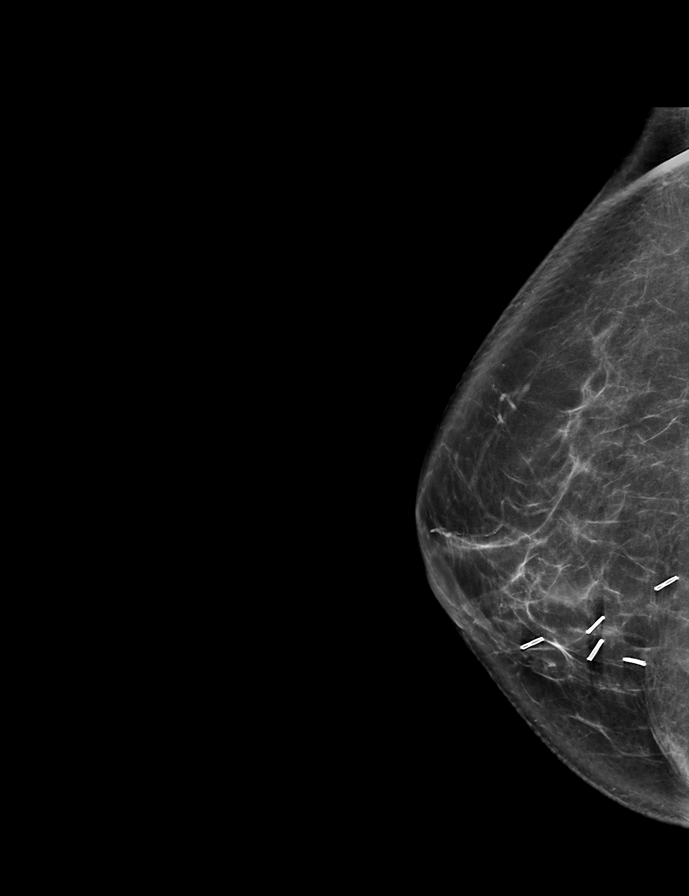

[L CC synth-2D]
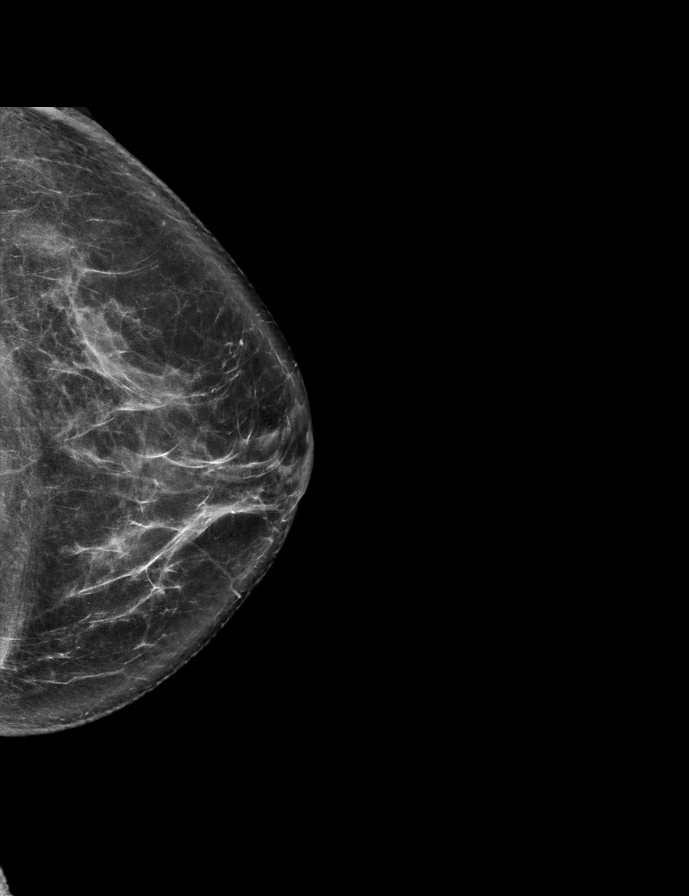

[R MLO synth-2D]
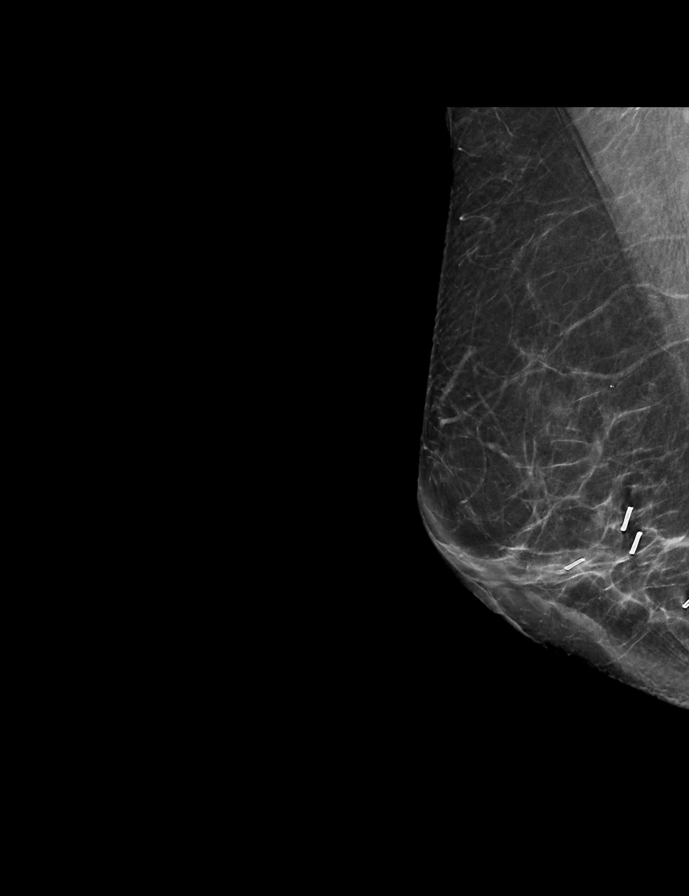

[L MLO synth-2D]
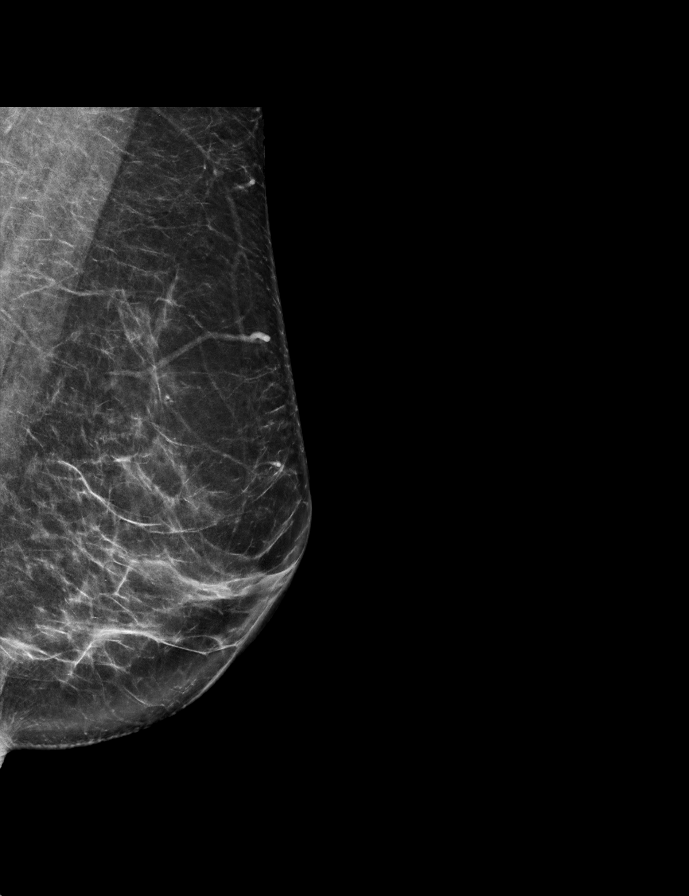

[L CC tomo · tomo slice 37/72.0]
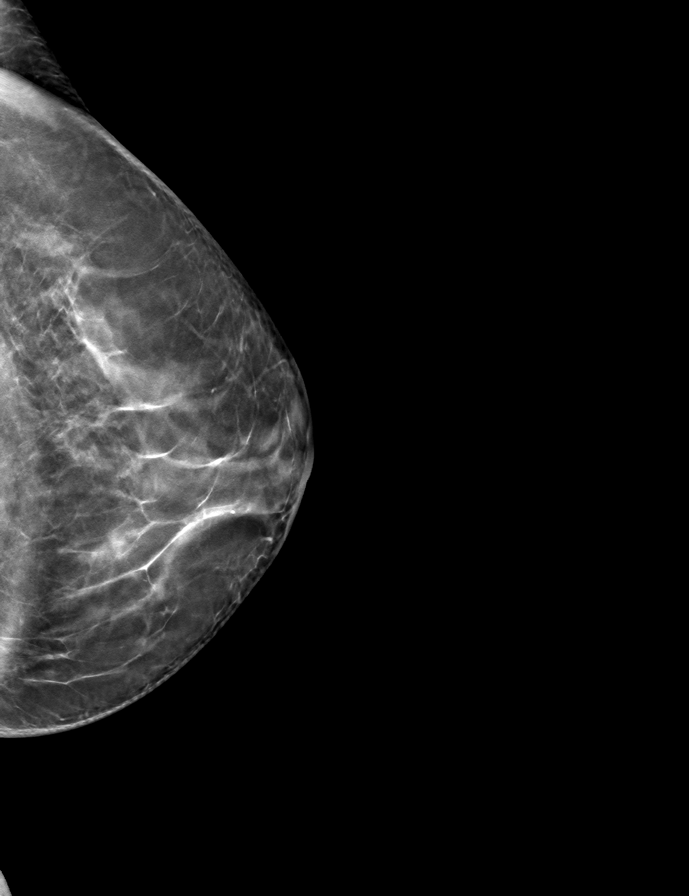

[R MLO tomo · tomo slice 36/71.0]
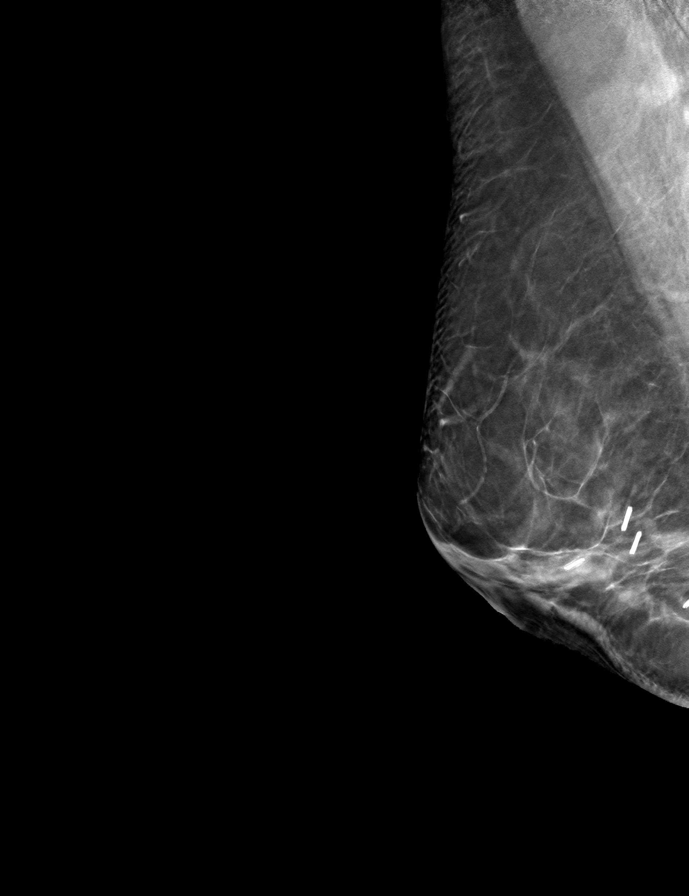

[R CC tomo · tomo slice 37/72.0]
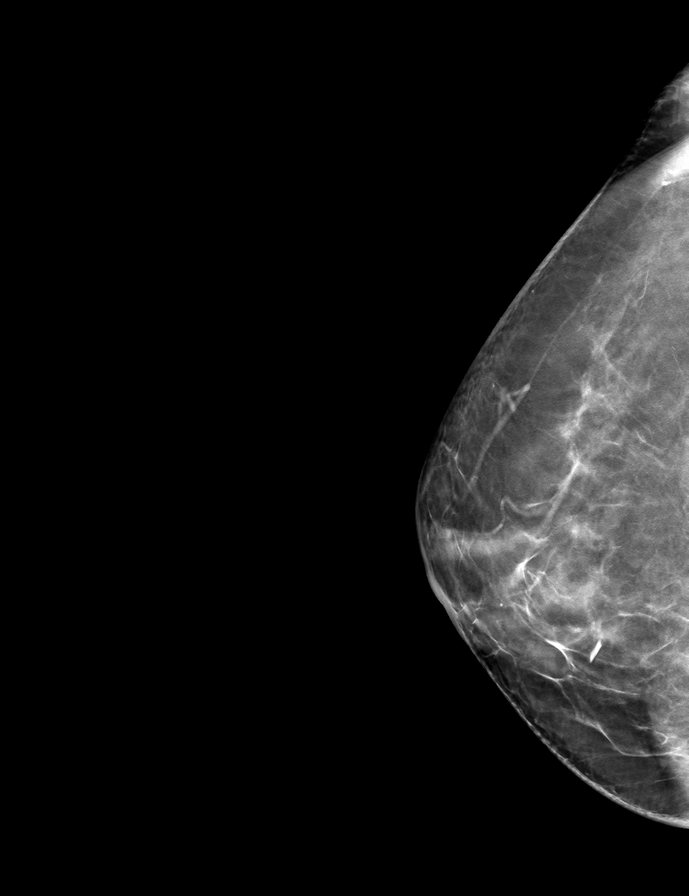

[L MLO tomo · tomo slice 37/74.0]
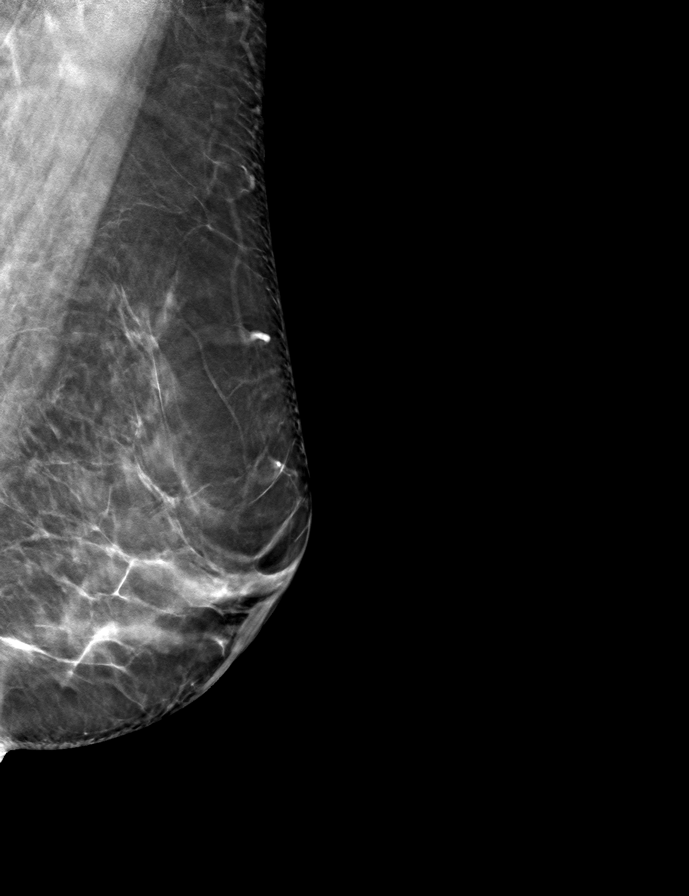

[9 of 25 positions shown; findings below may reference images not displayed]

ACR Breast Density Category c: The breast tissue is heterogeneously
dense, which may obscure small masses.
FINDINGS: Stable post lumpectomy changes on the right. No interval findings
suspicious for malignancy in either breast.

Mammographic images were processed with CAD.
IMPRESSION: No evidence of malignancy.

RECOMMENDATION:
Bilateral diagnostic mammogram in 1 year.

I have discussed the findings and recommendations with the patient.
If applicable, a reminder letter will be sent to the patient
regarding the next appointment.

BI-RADS CATEGORY  2: Benign.

## 2022-12-16 DIAGNOSIS — S43422A Sprain of left rotator cuff capsule, initial encounter: Secondary | ICD-10-CM | POA: Diagnosis not present

## 2023-01-29 ENCOUNTER — Other Ambulatory Visit: Payer: Self-pay | Admitting: *Deleted

## 2023-01-29 ENCOUNTER — Other Ambulatory Visit: Payer: Self-pay | Admitting: Physician Assistant

## 2023-01-29 DIAGNOSIS — Z1231 Encounter for screening mammogram for malignant neoplasm of breast: Secondary | ICD-10-CM

## 2023-01-29 MED ORDER — ANASTROZOLE 1 MG PO TABS: 1.0000 mg | ORAL_TABLET | Freq: Every day | ORAL | 2 refills | Status: AC

## 2023-03-17 ENCOUNTER — Telehealth: Payer: Self-pay | Admitting: Hematology and Oncology

## 2023-03-17 NOTE — Telephone Encounter (Signed)
Left patient a message in regards to rescheduled appointment times/dates

## 2023-03-23 DIAGNOSIS — U071 COVID-19: Secondary | ICD-10-CM | POA: Diagnosis not present

## 2023-04-01 ENCOUNTER — Ambulatory Visit: Payer: Medicare Other | Admitting: Hematology and Oncology

## 2023-04-07 DIAGNOSIS — Z0000 Encounter for general adult medical examination without abnormal findings: Secondary | ICD-10-CM | POA: Diagnosis not present

## 2023-04-07 DIAGNOSIS — E559 Vitamin D deficiency, unspecified: Secondary | ICD-10-CM | POA: Diagnosis not present

## 2023-04-07 DIAGNOSIS — M810 Age-related osteoporosis without current pathological fracture: Secondary | ICD-10-CM | POA: Diagnosis not present

## 2023-04-07 DIAGNOSIS — Z862 Personal history of diseases of the blood and blood-forming organs and certain disorders involving the immune mechanism: Secondary | ICD-10-CM | POA: Diagnosis not present

## 2023-04-07 DIAGNOSIS — Z9181 History of falling: Secondary | ICD-10-CM | POA: Diagnosis not present

## 2023-04-07 DIAGNOSIS — C50919 Malignant neoplasm of unspecified site of unspecified female breast: Secondary | ICD-10-CM | POA: Diagnosis not present

## 2023-04-07 DIAGNOSIS — E7800 Pure hypercholesterolemia, unspecified: Secondary | ICD-10-CM | POA: Diagnosis not present

## 2023-04-07 DIAGNOSIS — L729 Follicular cyst of the skin and subcutaneous tissue, unspecified: Secondary | ICD-10-CM | POA: Diagnosis not present

## 2023-04-07 DIAGNOSIS — Z139 Encounter for screening, unspecified: Secondary | ICD-10-CM | POA: Diagnosis not present

## 2023-04-10 ENCOUNTER — Other Ambulatory Visit: Payer: Self-pay | Admitting: Physician Assistant

## 2023-04-10 DIAGNOSIS — M810 Age-related osteoporosis without current pathological fracture: Secondary | ICD-10-CM

## 2023-04-23 ENCOUNTER — Inpatient Hospital Stay: Payer: PRIVATE HEALTH INSURANCE | Attending: Hematology and Oncology | Admitting: Hematology and Oncology

## 2023-04-23 ENCOUNTER — Encounter: Payer: Self-pay | Admitting: Hematology and Oncology

## 2023-04-23 VITALS — BP 118/62 | HR 58 | Temp 97.9°F | Resp 17 | Wt 143.1 lb

## 2023-04-23 DIAGNOSIS — C50111 Malignant neoplasm of central portion of right female breast: Secondary | ICD-10-CM | POA: Diagnosis not present

## 2023-04-23 DIAGNOSIS — Z170 Estrogen receptor positive status [ER+]: Secondary | ICD-10-CM | POA: Insufficient documentation

## 2023-04-23 DIAGNOSIS — C50311 Malignant neoplasm of lower-inner quadrant of right female breast: Secondary | ICD-10-CM | POA: Insufficient documentation

## 2023-04-23 DIAGNOSIS — N951 Menopausal and female climacteric states: Secondary | ICD-10-CM | POA: Diagnosis not present

## 2023-04-23 DIAGNOSIS — Z79811 Long term (current) use of aromatase inhibitors: Secondary | ICD-10-CM | POA: Insufficient documentation

## 2023-04-23 DIAGNOSIS — R232 Flushing: Secondary | ICD-10-CM | POA: Diagnosis not present

## 2023-04-23 DIAGNOSIS — M810 Age-related osteoporosis without current pathological fracture: Secondary | ICD-10-CM | POA: Insufficient documentation

## 2023-04-23 NOTE — Assessment & Plan Note (Signed)
Assessment and Plan  ASSESSMENT: 77 y.o. [place], [place] woman status post right breast central biopsy 06/21/2018 for clinical T1c N0, stage IA invasive ductal carcinoma, grade 2 or 3, estrogen and progesterone receptor positive, HER-2 not amplified, with an MIB-1 of 5%  (1) status post right lumpectomy 07/12/2018 for a pT1b pN0, stage IA invasive ductal carcinoma, grade 2, with close but negative margins.  There was evidence of perineural invasion.  A total of 2 sentinel lymph nodes were removed  (2) opted against adjuvant radiation  (3) anastrozole started 08/04/2018  (a) bone density 08/10/2018 showed a T score of -1.9    Breast Cancer On Anastrozole with intermittent hot flashes. Near completion of treatment course. -Continue Anastrozole until completion in January 2025. -Schedule mammogram for July 16, 2023.  Post-COVID-19 Syndrome Persistent cough following COVID-19 infection last month. Gradual improvement noted. -Continue supportive care.  Osteoporosis Discontinued Fosamax for about a year. Bone density test scheduled for May 28, 2023. -Complete scheduled bone density test. -Encourage regular exercise for bone health.  General Health Maintenance -Transition to primary care physician for annual mammograms after December 2024. -Encourage regular exercise for overall health and well-being.

## 2023-04-23 NOTE — Progress Notes (Signed)
[facility]  Telephone:([phone] [phone]   ID: Emily Bell DOB: Oct 26, 1945  MR#: [id number]  HQI#:[id number]  Patient Care Team: Lonie Peak, Cordelia Poche as PCP - General (Physician Assistant) Magrinat, Valentino Hue, MD (Inactive) as Consulting Physician (Oncology) Abigail Miyamoto, MD as Consulting Physician (General Surgery) Lonie Peak, MD as Attending Physician (Radiation Oncology) Sharrell Ku, MD as Consulting Physician (Gastroenterology) OTHER MD:   CHIEF COMPLAINT: Estrogen receptor positive breast cancer  CURRENT TREATMENT: Anastrozole   INTERVAL HISTORY:  [name] is here for a follow up on anastrozole.  Discussed the use of AI scribe software for clinical note transcription with the patient, who gave verbal consent to proceed.  History of Present Illness   The patient, with a history of breast cancer, presents for follow-up after a recent COVID-19 infection. She reports a lingering cough, which she believes is gradually improving. Other symptoms from the COVID-19 infection included a headache and general malaise, which were severe enough to confine her to bed for a couple of days. She was prescribed an unspecified medication for the infection, which she believes helped her recovery.  The patient is currently taking anastrozole for breast cancer and reports occasional hot flashes as a side effect. She is nearing the end of her course of treatment and plans to continue the medication until a follow-up mammogram in December. She has not noticed any changes in her breast.  The patient also reports having stopped taking Fosamax about a year ago after taking it for several years. She is due for a bone density test next month. She has not resumed her exercise routine since recovering from COVID-19.        REVIEW OF SYSTEMS:A detailed review of systems today was otherwise noncontributory   COVID 19 VACCINATION STATUS: Status post Moderna x2  followed by booster x2   HISTORY OF CURRENT ILLNESS: From the original intake note:  Emily Bell presented with focal pain in the retroareolar right breast as well as in the inferior left breast near the inframammary fold. She originally had a routine screening in 08/2018, but called and had her mammography pushed up. She underwent bilateral diagnostic mammography with tomography and bilateral breast ultrasonography at [facility] on 06/17/2018 showing: Breast Density Category B. In the lower slightly inner quadrant of the right breast there is an irregular mass which on the CC view appears to be associated with some distortion. The mass measures approximately 1 cm.  On physical exam, no discrete palpable masses are identified in the medial right breast. Ultrasound of the right breast at 3 o'clock, 1 cm from the nipple demonstrates an irregular hypoechoic mass measuring 1.1 x 0.5 x 0.7 cm. Ultrasound of the right axilla demonstrates multiple normal-appearing lymph nodes.   There are no suspicious findings seen at the site of focal pain in the anterior left breast. Ultrasound of the left breast at the site of pain demonstrates normal fibroglandular tissue. No masses or suspicious areas of shadowing are identified.   Accordingly on 06/21/2018 she proceeded to biopsy of the right breast area in question. The pathology from this procedure showed ([id number]): invasive mammary carcinoma, Grade II - III, and a mammary carcinoma in situ, intermediate nuclear grade. E-cadherin is positive, consistent with a ductal phenotype. Prognostic indicators significant for: estrogen receptor, 90% positive and progesterone receptor, 90% positive, both with strong staining intensity. Proliferation marker Ki67 at 5%. HER2 negative by immunohistochemistry (1+).   On 07/12/2018 she underwent a right breast partial  mastectomy. Pathology from this procedure ([id number]) shows an invasive ductal carcinoma, grade II/III,  spanning 1.1 cm, and a ductal carcinoma in situ, low grade. Perineural invasion is identified. Ductal carcinoma in situ is focally 0.1 cm to the anterior margin. Invasive ductal carcinoma is focally less than 0.1 cm to the medial margin. Both sentinel right axillary lymph nodes biopsied were clear.  The patient's subsequent history is as detailed below.   PAST MEDICAL HISTORY: Past Medical History:  Diagnosis Date   Anemia    Cancer (HCC)    right breast   Colon polyps    Hypercholesteremia    Osteoporosis    Vitamin B 12 deficiency    Vitamin D deficiency     PAST SURGICAL HISTORY: Past Surgical History:  Procedure Laterality Date   BREAST LUMPECTOMY Right 07/12/2018   BREAST LUMPECTOMY WITH RADIOACTIVE SEED AND SENTINEL LYMPH NODE BIOPSY Right 07/12/2018   Procedure: RIGHT BREAST PARTIAL MASTECTOMY WITH RADIOACTIVE SEED AND SENTINEL LYMPH NODE BIOPSY;  Surgeon: Abigail Miyamoto, MD;  Location: [facility] OR;  Service: General;  Laterality: Right;   COLONOSCOPY     neg hx      FAMILY HISTORY: Family History  Problem Relation Age of Onset   Hypertension Mother    Stomach cancer Mother    Cancer Cousin        Breast   Colon cancer Neg Hx   Glinda's father died from pneumonia at age 21. Patients' mother died from "stomach cancer in the lining of her intestines" at age 67. The patient has 2 brothers. The patient has one first cousin with breast cancer. Patient denies anyone in her family having ovarian, prostate or pancreatic cancer.    GYNECOLOGIC HISTORY:  No LMP recorded. Patient is postmenopausal. 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 Her husband, Garlan Fair, is retired from Engineer, site.  The patient has 3 children: Emily Bell, and Emily Bell.  Emily Bell is 39, lives in [place], and is retired from the [employer]. Emily Bell is 49, lives in [place], [place], and is a Risk analyst. Emily Spray is 63, lives in [place], [place], and drives long distances. Shatasha has 5 grandchildren, 4 of them in their 63s, 1 (in [place]) 77 years old.  No great grandchildren. She attends [employer].   ADVANCED DIRECTIVES: In the absence of any documentation to the contrary, the patient's spouse is their HCPOA.    HEALTH MAINTENANCE: Social History   Tobacco Use   Smoking status: Never   Smokeless tobacco: Never  Vaping Use   Vaping status: Never Used  Substance Use Topics   Alcohol use: No   Drug use: No    Colonoscopy: March 2012, [facility]  P[facility]: 4+ years [facility]  Bone density: 09/2020, -1.4  No Known Allergies  Current Outpatient Medications  Medication Sig Dispense Refill   alendronate (FOSAMAX) 70 MG tablet Take 1 tablet (70 mg total) by mouth once a week. Take with a full glass of water on an empty stomach. 12 tablet 4   anastrozole (ARIMIDEX) 1 MG tablet Take 1 tablet (1 mg total) by mouth daily. 90 tablet 2   aspirin 81 MG tablet Take 81 mg by mouth daily.  Calcium Carb-Cholecalciferol (CALCIUM 600 + D PO) Take 1 tablet by mouth daily.     Cholecalciferol (VITAMIN D3) 2000 UNITS TABS Take 2,000 Units by mouth daily.      Coenzyme Q10 (CO Q 10 PO) Take 200 mg by mouth daily.      magnesium oxide (MAG-OX) 400 MG tablet Take 400 mg by mouth daily.     rosuvastatin (CRESTOR) 5 MG tablet Take 5 mg by mouth daily.     vitamin B-12 (CYANOCOBALAMIN) 1000 MCG tablet Take 1,000 mcg by mouth daily.     No current facility-administered medications for this visit.     OBJECTIVE: African-American woman who appears well  Vitals:   04/23/23 0844  BP: 118/62  Pulse: (!) 58  Resp: 17  Temp: 97.9 F (36.6 C)  SpO2: 100%     Body mass index is 23.81 kg/m.   Wt Readings from Last 3 Encounters:   04/23/23 143 lb 1.6 oz (64.9 kg)  03/31/22 142 lb 6.4 oz (64.6 kg)  10/01/21 145 lb 9.6 oz (66 kg)      ECOG FS:0 - Asymptomatic   Physical Exam Constitutional:      Appearance: Normal appearance.  Cardiovascular:     Rate and Rhythm: Normal rate and regular rhythm.  Pulmonary:     Effort: Pulmonary effort is normal.     Breath sounds: Normal breath sounds.  Chest:     Comments: Bilateral breasts inspected. No palpable masses or regional adenopathy. Small postop seroma in the right breast lumpectomy site.  No other palpable changes Musculoskeletal:        General: No swelling.     Cervical back: No rigidity.  Lymphadenopathy:     Cervical: No cervical adenopathy.  Skin:    General: Skin is warm and dry.  Neurological:     General: No focal deficit present.     Mental Status: She is alert.    LAB RESULTS:  CMP     Component Value Date/Time   NA 139 10/01/2021 1436   K 4.0 10/01/2021 1436   CL 109 10/01/2021 1436   CO2 26 10/01/2021 1436   GLUCOSE 115 (H) 10/01/2021 1436   BUN 12 10/01/2021 1436   CREATININE 0.85 10/01/2021 1436   CALCIUM 9.2 10/01/2021 1436   PROT 6.6 10/01/2021 1436   ALBUMIN 4.0 10/01/2021 1436   AST 19 10/01/2021 1436   ALT 15 10/01/2021 1436   ALKPHOS 41 10/01/2021 1436   BILITOT 0.5 10/01/2021 1436   GFRNONAA >60 10/01/2021 1436   GFRAA >60 07/05/2019 1000   GFRAA >60 07/26/2018 1459    Lab Results  Component Value Date   WBC 5.8 10/01/2021   NEUTROABS 2.9 10/01/2021   HGB 11.6 (L) 10/01/2021   HCT 37.1 10/01/2021   MCV 83.0 10/01/2021   PLT 261 10/01/2021    No results found for: "LABCA2"  No components found for: "ZOXWRU045"  No results for input(s): "INR" in the last 168 hours.  No results found for: "LABCA2"  No results found for: "WUJ811"  No results found for: "CAN125"  No results found for: "CAN153"  No results found for: "CA2729"  No components found for: "HGQUANT"  No results found for: "CEA1", "CEA" /  No results found for: "CEA1", "CEA"   No results found for: "AFPTUMOR"  No results found for: "CHROMOGRNA"  No results found for: "TOTALPROTELP", "ALBUMINELP", "A1GS", "A2GS", "BETS", "BETA2SER", "GAMS", "MSPIKE", "SPEI" (this displays SPEP labs)  No results found for: "KPAFRELGTCHN", "LAMBDASER", "  KAPLAMBRATIO" (kappa/lambda light chains)  No results found for: "HGBA", "HGBA2QUANT", "HGBFQUANT", "HGBSQUAN" (Hemoglobinopathy evaluation)   No results found for: "LDH"  No results found for: "IRON", "TIBC", "IRONPCTSAT" (Iron and TIBC)  No results found for: "FERRITIN"  Urinalysis No results found for: "COLORURINE", "APPEARANCEUR", "LABSPEC", "PHURINE", "GLUCOSEU", "HGBUR", "BILIRUBINUR", "KETONESUR", "PROTEINUR", "UROBILINOGEN", "NITRITE", "LEUKOCYTESUR"   STUDIES:  No results found.   ELIGIBLE FOR AVAILABLE RESEARCH PROTOCOL: no  Carcinoma of central portion of right breast in female, estrogen receptor positive (HCC) Assessment and Plan  ASSESSMENT: 77 y.o. [place], [place] woman status post right breast central biopsy 06/21/2018 for clinical T1c N0, stage IA invasive ductal carcinoma, grade 2 or 3, estrogen and progesterone receptor positive, HER-2 not amplified, with an MIB-1 of 5%  (1) status post right lumpectomy 07/12/2018 for a pT1b pN0, stage IA invasive ductal carcinoma, grade 2, with close but negative margins.  There was evidence of perineural invasion.  A total of 2 sentinel lymph nodes were removed  (2) opted against adjuvant radiation  (3) anastrozole started 08/04/2018  (a) bone density 08/10/2018 showed a T score of -1.9    Breast Cancer On Anastrozole with intermittent hot flashes. Near completion of treatment course. -Continue Anastrozole until completion in January 2025. -Schedule mammogram for July 16, 2023.  Post-COVID-19 Syndrome Persistent cough following COVID-19 infection last month. Gradual improvement noted. -Continue supportive  care.  Osteoporosis Discontinued Fosamax for about a year. Bone density test scheduled for May 28, 2023. -Complete scheduled bone density test. -Encourage regular exercise for bone health.  General Health Maintenance -Transition to primary care physician for annual mammograms after December 2024. -Encourage regular exercise for overall health and well-being.         Total time spent: 30 minutes  *Total Encounter Time as defined by the Centers for Medicare and Medicaid Services includes, in addition to the face-to-face time of a patient visit (documented in the note above) non-face-to-face time: obtaining and reviewing outside history, ordering and reviewing medications, tests or procedures, care coordination (communications with other health care professionals or caregivers) and documentation in the medical record.

## 2023-05-08 ENCOUNTER — Ambulatory Visit
Admission: RE | Admit: 2023-05-08 | Discharge: 2023-05-08 | Disposition: A | Discharge status: Home / Self Care | Payer: Medicare HMO | Source: Ambulatory Visit | Attending: Physician Assistant | Admitting: Physician Assistant

## 2023-05-08 DIAGNOSIS — M810 Age-related osteoporosis without current pathological fracture: Secondary | ICD-10-CM

## 2023-05-08 DIAGNOSIS — N958 Other specified menopausal and perimenopausal disorders: Secondary | ICD-10-CM | POA: Diagnosis not present

## 2023-05-08 DIAGNOSIS — E349 Endocrine disorder, unspecified: Secondary | ICD-10-CM | POA: Diagnosis not present

## 2023-05-08 DIAGNOSIS — M8588 Other specified disorders of bone density and structure, other site: Secondary | ICD-10-CM | POA: Diagnosis not present

## 2023-05-13 DIAGNOSIS — N289 Disorder of kidney and ureter, unspecified: Secondary | ICD-10-CM | POA: Diagnosis not present

## 2023-05-27 DIAGNOSIS — H26493 Other secondary cataract, bilateral: Secondary | ICD-10-CM | POA: Diagnosis not present

## 2023-05-27 DIAGNOSIS — H04123 Dry eye syndrome of bilateral lacrimal glands: Secondary | ICD-10-CM | POA: Diagnosis not present

## 2023-05-27 DIAGNOSIS — H40003 Preglaucoma, unspecified, bilateral: Secondary | ICD-10-CM | POA: Diagnosis not present

## 2023-06-26 DIAGNOSIS — Z0100 Encounter for examination of eyes and vision without abnormal findings: Secondary | ICD-10-CM | POA: Diagnosis not present

## 2023-07-13 ENCOUNTER — Other Ambulatory Visit: Payer: Self-pay | Admitting: Physician Assistant

## 2023-07-13 DIAGNOSIS — Z1231 Encounter for screening mammogram for malignant neoplasm of breast: Secondary | ICD-10-CM

## 2023-07-13 DIAGNOSIS — Z853 Personal history of malignant neoplasm of breast: Secondary | ICD-10-CM

## 2023-07-13 DIAGNOSIS — Z9889 Other specified postprocedural states: Secondary | ICD-10-CM

## 2023-07-16 ENCOUNTER — Ambulatory Visit
Admission: RE | Admit: 2023-07-16 | Discharge: 2023-07-16 | Disposition: A | Discharge status: Home / Self Care | Payer: Medicare HMO | Source: Ambulatory Visit | Attending: Physician Assistant | Admitting: Physician Assistant

## 2023-07-16 DIAGNOSIS — Z9889 Other specified postprocedural states: Secondary | ICD-10-CM

## 2023-07-16 DIAGNOSIS — Z853 Personal history of malignant neoplasm of breast: Secondary | ICD-10-CM

## 2023-07-16 DIAGNOSIS — Z08 Encounter for follow-up examination after completed treatment for malignant neoplasm: Secondary | ICD-10-CM | POA: Diagnosis not present

## 2023-09-29 DIAGNOSIS — Z23 Encounter for immunization: Secondary | ICD-10-CM | POA: Diagnosis not present

## 2024-02-29 ENCOUNTER — Other Ambulatory Visit: Payer: Self-pay | Admitting: Physician Assistant

## 2024-02-29 DIAGNOSIS — Z1231 Encounter for screening mammogram for malignant neoplasm of breast: Secondary | ICD-10-CM

## 2024-04-11 ENCOUNTER — Other Ambulatory Visit: Payer: Self-pay | Admitting: Physician Assistant

## 2024-04-11 DIAGNOSIS — E559 Vitamin D deficiency, unspecified: Secondary | ICD-10-CM | POA: Diagnosis not present

## 2024-04-11 DIAGNOSIS — M810 Age-related osteoporosis without current pathological fracture: Secondary | ICD-10-CM | POA: Diagnosis not present

## 2024-04-11 DIAGNOSIS — E7800 Pure hypercholesterolemia, unspecified: Secondary | ICD-10-CM | POA: Diagnosis not present

## 2024-04-11 DIAGNOSIS — Z139 Encounter for screening, unspecified: Secondary | ICD-10-CM | POA: Diagnosis not present

## 2024-04-11 DIAGNOSIS — Z9181 History of falling: Secondary | ICD-10-CM | POA: Diagnosis not present

## 2024-04-11 DIAGNOSIS — Z853 Personal history of malignant neoplasm of breast: Secondary | ICD-10-CM

## 2024-04-11 DIAGNOSIS — B354 Tinea corporis: Secondary | ICD-10-CM | POA: Diagnosis not present

## 2024-04-11 DIAGNOSIS — Z0000 Encounter for general adult medical examination without abnormal findings: Secondary | ICD-10-CM | POA: Diagnosis not present

## 2024-04-11 DIAGNOSIS — Z862 Personal history of diseases of the blood and blood-forming organs and certain disorders involving the immune mechanism: Secondary | ICD-10-CM | POA: Diagnosis not present

## 2024-04-11 DIAGNOSIS — Z23 Encounter for immunization: Secondary | ICD-10-CM | POA: Diagnosis not present

## 2024-05-11 DIAGNOSIS — Z23 Encounter for immunization: Secondary | ICD-10-CM | POA: Diagnosis not present

## 2024-07-19 ENCOUNTER — Ambulatory Visit
Admission: RE | Admit: 2024-07-19 | Discharge: 2024-07-19 | Disposition: A | Payer: PRIVATE HEALTH INSURANCE | Source: Ambulatory Visit | Attending: Physician Assistant | Admitting: Physician Assistant

## 2024-07-19 DIAGNOSIS — Z853 Personal history of malignant neoplasm of breast: Secondary | ICD-10-CM

## 2024-08-12 ENCOUNTER — Other Ambulatory Visit: Payer: Self-pay | Admitting: Physician Assistant

## 2024-08-12 DIAGNOSIS — Z853 Personal history of malignant neoplasm of breast: Secondary | ICD-10-CM
